# Patient Record
Sex: Female | Born: 1960 | Race: Asian | Hispanic: No | Marital: Married | Smoking: Never smoker
Health system: Southern US, Community
[De-identification: ages and names within clinical notes are randomized; demographics above are authoritative.]

## PROBLEM LIST (undated history)

## (undated) DIAGNOSIS — M5442 Lumbago with sciatica, left side: Secondary | ICD-10-CM

## (undated) DIAGNOSIS — G8929 Other chronic pain: Secondary | ICD-10-CM

## (undated) DIAGNOSIS — K219 Gastro-esophageal reflux disease without esophagitis: Secondary | ICD-10-CM

## (undated) DIAGNOSIS — R7303 Prediabetes: Secondary | ICD-10-CM

## (undated) DIAGNOSIS — Z853 Personal history of malignant neoplasm of breast: Secondary | ICD-10-CM

## (undated) DIAGNOSIS — I10 Essential (primary) hypertension: Secondary | ICD-10-CM

## (undated) HISTORY — DX: Other chronic pain: G89.29

## (undated) HISTORY — DX: Prediabetes: R73.03

## (undated) HISTORY — DX: Essential (primary) hypertension: I10

## (undated) HISTORY — DX: Personal history of malignant neoplasm of breast: Z85.3

## (undated) HISTORY — DX: Gastro-esophageal reflux disease without esophagitis: K21.9

## (undated) HISTORY — DX: Other chronic pain: M54.42

---

## 2002-09-04 HISTORY — PX: ESOPHAGOGASTRODUODENOSCOPY: SHX1529

## 2004-06-01 ENCOUNTER — Ambulatory Visit: Payer: Self-pay

## 2004-06-09 ENCOUNTER — Ambulatory Visit: Payer: Self-pay

## 2004-08-15 LAB — CONVERTED CEMR LAB: Pap Smear: NORMAL

## 2007-01-03 DIAGNOSIS — Z853 Personal history of malignant neoplasm of breast: Secondary | ICD-10-CM

## 2007-01-03 HISTORY — PX: MASTECTOMY: SHX3

## 2007-01-03 HISTORY — DX: Personal history of malignant neoplasm of breast: Z85.3

## 2007-08-12 ENCOUNTER — Ambulatory Visit: Payer: Self-pay | Admitting: Internal Medicine

## 2007-08-12 DIAGNOSIS — D649 Anemia, unspecified: Secondary | ICD-10-CM | POA: Insufficient documentation

## 2007-08-12 DIAGNOSIS — R42 Dizziness and giddiness: Secondary | ICD-10-CM | POA: Insufficient documentation

## 2007-08-12 LAB — CONVERTED CEMR LAB
ALT: 12 units/L (ref 0–35)
Albumin: 4.1 g/dL (ref 3.5–5.2)
BUN: 16 mg/dL (ref 6–23)
Basophils Relative: 0 % (ref 0.0–3.0)
Bilirubin, Direct: 0.1 mg/dL (ref 0.0–0.3)
CO2: 26 meq/L (ref 19–32)
Calcium: 9 mg/dL (ref 8.4–10.5)
Creatinine, Ser: 0.6 mg/dL (ref 0.4–1.2)
GFR calc Af Amer: 137 mL/min
Glucose, Bld: 73 mg/dL (ref 70–99)
HCT: 33.2 % — ABNORMAL LOW (ref 36.0–46.0)
Hemoglobin: 10.7 g/dL — ABNORMAL LOW (ref 12.0–15.0)
Lymphocytes Relative: 18.2 % (ref 12.0–46.0)
MCHC: 32.2 g/dL (ref 30.0–36.0)
Monocytes Absolute: 0.3 10*3/uL (ref 0.1–1.0)
Monocytes Relative: 3 % (ref 3.0–12.0)
Neutro Abs: 7.9 10*3/uL — ABNORMAL HIGH (ref 1.4–7.7)
RBC: 4.46 M/uL (ref 3.87–5.11)
RDW: 17.8 % — ABNORMAL HIGH (ref 11.5–14.6)
Sodium: 140 meq/L (ref 135–145)
TSH: 1.82 microintl units/mL (ref 0.35–5.50)
Total Protein: 7.6 g/dL (ref 6.0–8.3)

## 2007-08-13 ENCOUNTER — Encounter: Payer: Self-pay | Admitting: Internal Medicine

## 2013-01-02 LAB — HM COLONOSCOPY

## 2013-04-16 HISTORY — PX: COLONOSCOPY: SHX174

## 2015-10-18 ENCOUNTER — Encounter: Payer: Self-pay | Admitting: *Deleted

## 2016-07-23 ENCOUNTER — Encounter: Payer: Self-pay | Admitting: *Deleted

## 2016-07-23 DIAGNOSIS — Z139 Encounter for screening, unspecified: Secondary | ICD-10-CM

## 2016-07-23 LAB — GLUCOSE, POCT (MANUAL RESULT ENTRY): POC GLUCOSE: 197 mg/dL — AB (ref 70–99)

## 2016-07-23 NOTE — Congregational Nurse Program (Unsigned)
Congregational Nurse Program Note  Date of Encounter: 07/23/2016  Past Medical History: No past medical history on file.  Encounter Details:     CNP Questionnaire - 07/23/16 1400      Patient Demographics   Is this a new or existing patient? New   Patient is considered a/an Immigrant   Race Asian     Patient Assistance   Location of Patient Assistance Not Applicable  [facility]   Patient's financial/insurance status Self-Pay (Uninsured)   Uninsured Patient ([employer]/Care Connects) No   Patient referred to apply for the following financial assistance Not Applicable   Food insecurities addressed Not Applicable   Transportation assistance No   Assistance securing medications No   Educational health offerings Spiritual care;Exercise/physical activity;Safety;Nutrition     Encounter Details   Primary purpose of visit Education/Health Concerns;Spiritual Care/Support Visit;Other  referral MD   Was an Emergency Department visit averted? Not Applicable   Does patient have a medical provider? No   Patient referred to Doctor referral for a non-emergent behavioral health crisis   Was a mental health screening completed? (GAINS tool) No   Does patient have dental issues? No   Does patient have vision issues? No   Does your patient have an abnormal blood pressure today? No   Since previous encounter, have you referred patient for abnormal blood pressure that resulted in a new diagnosis or medication change? No   Does your patient have an abnormal blood glucose today? Yes  195, non fasting   Since previous encounter, have you referred patient for abnormal blood glucose that resulted in a new diagnosis or medication change? No   Was there a life-saving intervention made? No     stated been dizziness 3weeks, no energy. Said no primary Dr. For now. Same sx of last time. Referral to primary Dr. Hughes BetterNear her house, concerned language barrier, told her interpretor system or I can be with her. Will  set up appointment  And f/u.

## 2016-07-26 ENCOUNTER — Telehealth: Payer: Self-pay | Admitting: *Deleted

## 2016-08-07 ENCOUNTER — Encounter: Payer: Self-pay | Admitting: Family Medicine

## 2016-08-07 ENCOUNTER — Ambulatory Visit (INDEPENDENT_AMBULATORY_CARE_PROVIDER_SITE_OTHER): Payer: BLUE CROSS/BLUE SHIELD | Admitting: Family Medicine

## 2016-08-07 VITALS — BP 145/78 | HR 63 | Temp 98.2°F | Resp 16 | Ht 63.75 in | Wt 137.5 lb

## 2016-08-07 DIAGNOSIS — R5382 Chronic fatigue, unspecified: Secondary | ICD-10-CM | POA: Diagnosis not present

## 2016-08-07 DIAGNOSIS — Z862 Personal history of diseases of the blood and blood-forming organs and certain disorders involving the immune mechanism: Secondary | ICD-10-CM

## 2016-08-07 DIAGNOSIS — R6889 Other general symptoms and signs: Secondary | ICD-10-CM | POA: Diagnosis not present

## 2016-08-07 DIAGNOSIS — R42 Dizziness and giddiness: Secondary | ICD-10-CM | POA: Diagnosis not present

## 2016-08-07 NOTE — Progress Notes (Signed)
Office Note 08/07/2016  CC:  Chief Complaint  Patient presents with  . Establish Care    Previous PCP: Dr. Artist PaisYoo  . Fatigue  She last ate about 3.5 hrs ago.  HPI:  Caitlin Thomas is a 56 y.o. Asian female who is here with her congregational nurse as her translator to establish care and discuss fatigue.  Despite translator being present, there is definitely still a language barrier/poor historian. Patient's most recent primary MD: none (Dr. Artist PaisYoo in remote past). Old records were not reviewed prior to or during today's visit.  Fairly vague symptoms: Generalized fatigue, hot flushes, gets a bit dizzy.  Difficult to get any specific info regarding time course. Onset about 2 yrs ago, then got better, then has returned the last 4-6 wks.  Sx's constant?, but then patient seems to make statements that are c/w intermittent pattern.  Has to stay in bed more on some days.   Gets worse when she is hungry.  Appetite is good.  Eating more frequently does help.  Seems to happen more/worse in the heat. Menopause age 56.   No melena or hematochezia.  No abd pain.  No HAs.  No joint pain or rashes.  No fevers. No wt loss.   Some snoring but she doesn't have any info regarding possible apneic events.  Feels sleepy after eating. Denies any signif recent stressor or anxiety.  Denies depression.   [place]/14[place]  Past Medical History:  Diagnosis Date  . History of left breast cancer 2009   unclear hx of other treatments (dx'd and [place])---"5 yrs of medication, then stopped".  No radiation.    Past Surgical History:  Procedure Laterality Date  . COLONOSCOPY  04/16/2013   Normal per pt: [place]  . ESOPHAGOGASTRODUODENOSCOPY  09/04/2002   Anemia, wt loss, abd pain---NORMAL [place]  . MASTECTOMY Left 2009   [place]    Family History  Problem Relation Age of Onset  . Stroke Neg Hx    . Diabetes Neg Hx   . Cancer Neg Hx     Social History   Social History  . Marital status: Married    Spouse name: N/A  . Number of children: N/A  . Years of education: N/A   Occupational History  . Not on file.   Social History Main Topics  . Smoking status: Never Smoker  . Smokeless tobacco: Never Used  . Alcohol use No  . Drug use: No  . Sexual activity: Not on file   Other Topics Concern  . Not on file   Social History Narrative   Married, 1 daughter.   Educ: HS   Occup: housewife.   No T/A/Ds.    No outpatient encounter prescriptions on file as of 08/07/2016.   No facility-administered encounter medications on file as of 08/07/2016.     No Known Allergies  ROS See HPI, plus: Review of Systems  Constitutional: Negative for fever.  HENT: Negative for congestion and sore throat.   Eyes: Negative for visual disturbance.  Respiratory: Negative for cough.   Cardiovascular: Negative for chest pain.  Gastrointestinal: Negative for abdominal pain and nausea.  Genitourinary: Negative for dysuria.  Musculoskeletal: Negative for back pain and joint swelling.  Skin: Negative for rash.  Neurological: Negative for weakness and headaches.  Hematological: Negative for adenopathy. Does not bruise/bleed easily.  Psychiatric/Behavioral: Negative  for dysphoric mood. The patient is not nervous/anxious.     PE; Blood pressure (!) 145/78, pulse 63, temperature 98.2 F (36.8 C), temperature source Oral, resp. rate 16, height 5' 3.75" (1.619 m), weight 137 lb 8 oz (62.4 kg), SpO2 99 %.  Pt examined in the presence of her female Nurse, learning disability. Gen: Alert, well appearing.  Patient is oriented to person, place, time, and situation. AFFECT: pleasant, lucid thought and speech. ENT: Ears: EACs clear, normal epithelium.  TMs with good light reflex and landmarks bilaterally.  Eyes: no injection, icteris, swelling, or exudate.  EOMI, PERRLA. Nose: no drainage or turbinate edema/swelling.   No injection or focal lesion.  Mouth: lips without lesion/swelling.  Oral mucosa pink and moist.  Dentition intact and without obvious caries or gingival swelling.  Oropharynx without erythema, exudate, or swelling.  Neck - No masses or thyromegaly or limitation in range of motion.  No bruits. CV: RRR, no m/r/g.   LUNGS: CTA bilat, nonlabored resps, good aeration in all lung fields. ABD: soft, NT, ND, BS normal.  No hepatospenomegaly or mass.  No bruits. EXT: no clubbing, cyanosis, or edema.    Pertinent labs:  none  ASSESSMENT AND PLAN:   New pt; no old records available ([place]  1) Fatigue: with sense of heat intolerance/flushing sensation, and disequilibrium. Sounds somewhat like possible mild hypoglycemic sx's.  Pt has unclear hx of anemia--with normal EGD 2004. Colonoscopy reportedly normal 2015, [place]  No clear data was obtained during history (language barrier) or exam to suggest any particular diagnosis, so I'll check some general labs to start: CBC, CMET, TSH, iron studies. Additionally, I've reiterated the importance of eating 6 small meals per day--not letting herself go long enough to get really hungry. Encouraged pt to drink 60 oz water per day.  Avoid going out in hot weather for more than 30 min at a time.  An After Visit Summary was printed and given to the patient.  Return in about 2 weeks (around 08/21/2016) for f/u fatigue.  Signed:  Santiago Bumpers, MD           08/07/2016

## 2016-08-07 NOTE — Patient Instructions (Signed)
Eat 6 small meals per day, drink 60 oz of clear fluids (water) per day, avoid prolonged periods (>30 min) in the heat.

## 2016-08-08 ENCOUNTER — Encounter: Payer: Self-pay | Admitting: *Deleted

## 2016-08-08 LAB — COMPREHENSIVE METABOLIC PANEL
ALBUMIN: 4.6 g/dL (ref 3.6–5.1)
ALT: 18 U/L (ref 6–29)
AST: 18 U/L (ref 10–35)
Alkaline Phosphatase: 58 U/L (ref 33–130)
BUN: 18 mg/dL (ref 7–25)
CHLORIDE: 103 mmol/L (ref 98–110)
CO2: 28 mmol/L (ref 20–32)
Calcium: 9.3 mg/dL (ref 8.6–10.4)
Creat: 0.63 mg/dL (ref 0.50–1.05)
Glucose, Bld: 130 mg/dL — ABNORMAL HIGH (ref 65–99)
POTASSIUM: 4.3 mmol/L (ref 3.5–5.3)
Sodium: 138 mmol/L (ref 135–146)
TOTAL PROTEIN: 7.5 g/dL (ref 6.1–8.1)
Total Bilirubin: 0.5 mg/dL (ref 0.2–1.2)

## 2016-08-08 LAB — CBC WITH DIFFERENTIAL/PLATELET
BASOS ABS: 0 {cells}/uL (ref 0–200)
Basophils Relative: 0 %
EOS PCT: 3 %
Eosinophils Absolute: 228 cells/uL (ref 15–500)
HEMATOCRIT: 43.1 % (ref 35.0–45.0)
HEMOGLOBIN: 13.8 g/dL (ref 11.7–15.5)
LYMPHS ABS: 2888 {cells}/uL (ref 850–3900)
Lymphocytes Relative: 38 %
MCH: 29.9 pg (ref 27.0–33.0)
MCHC: 32 g/dL (ref 32.0–36.0)
MCV: 93.5 fL (ref 80.0–100.0)
MONO ABS: 532 {cells}/uL (ref 200–950)
MPV: 9.8 fL (ref 7.5–12.5)
Monocytes Relative: 7 %
NEUTROS PCT: 52 %
Neutro Abs: 3952 cells/uL (ref 1500–7800)
Platelets: 307 10*3/uL (ref 140–400)
RBC: 4.61 MIL/uL (ref 3.80–5.10)
RDW: 13 % (ref 11.0–15.0)
WBC: 7.6 10*3/uL (ref 3.8–10.8)

## 2016-08-08 LAB — IRON AND TIBC
%SAT: 31 % (ref 11–50)
Iron: 112 ug/dL (ref 45–160)
TIBC: 361 ug/dL (ref 250–450)
UIBC: 249 ug/dL

## 2016-08-08 LAB — TSH: TSH: 2.58 mIU/L

## 2016-08-08 LAB — FERRITIN: FERRITIN: 166 ng/mL (ref 10–232)

## 2016-08-08 NOTE — Congregational Nurse Program (Unsigned)
Congregational Nurse Program Note  Date of Encounter: 08/07/2016 Past Medical History: Past Medical History:  Diagnosis Date  . History of left breast cancer 2009   unclear hx of other treatments (dx[place] and [place])---"5 yrs of medication, then stopped".  No radiation.    Encounter Details:     CNP Questionnaire - 08/07/16 1500      Patient Demographics   Is this a new or existing patient? Existing   Patient is considered a/an Immigrant   Race Asian     Patient Assistance   Location of Patient Assistance Not Applicable   Patient's financial/insurance status Private Insurance Coverage   Uninsured Patient ([employer] Connects) No   Patient referred to apply for the following financial assistance Not Applicable   Food insecurities addressed Not Applicable   Transportation assistance No   Assistance securing medications No   Educational health offerings Nutrition     Encounter Details   Primary purpose of visit Navigating the Healthcare System  translate   Was an Emergency Department visit averted? Not Applicable   Does patient have a medical provider? Yes   Patient referred to Not Applicable   Was a mental health screening completed? (GAINS tool) No   Does patient have dental issues? No   Does patient have vision issues? No   Does your patient have an abnormal blood pressure today? No  see clinic vs   Since previous encounter, have you referred patient for abnormal blood pressure that resulted in a new diagnosis or medication change? No   Does your patient have an abnormal blood glucose today? No   Since previous encounter, have you referred patient for abnormal blood glucose that resulted in a new diagnosis or medication change? No   Was there a life-saving intervention made? No     visited  Dr. Milinda CaveMcGowen today. Labs drawn, f/u in 2weeks.  small 6meals, intake lots of water for sudden hunger and rehydration. C/o remains empty feeling even though ate a  lot. Advised to eat more protein and healthy fat like nuts, cheeze, avocado and  vegetables than carbohydrates. Will follow up with pt.

## 2016-08-21 ENCOUNTER — Encounter: Payer: Self-pay | Admitting: Family Medicine

## 2016-08-21 ENCOUNTER — Ambulatory Visit (INDEPENDENT_AMBULATORY_CARE_PROVIDER_SITE_OTHER): Payer: BLUE CROSS/BLUE SHIELD | Admitting: Family Medicine

## 2016-08-21 VITALS — BP 129/79 | HR 60 | Temp 98.5°F | Resp 16 | Ht 63.75 in | Wt 137.0 lb

## 2016-08-21 DIAGNOSIS — R5382 Chronic fatigue, unspecified: Secondary | ICD-10-CM | POA: Diagnosis not present

## 2016-08-21 NOTE — Patient Instructions (Addendum)
Take Vit D 1000 Unit tab once daily --over the counter. Take Magnesium Oxide 500mg , 1 tab daily--over the counter. Take Vit B2 (riboflavin) 100mg , 1 tab daily--over the counter.

## 2016-08-21 NOTE — Progress Notes (Signed)
OFFICE VISIT  08/21/2016   CC:  Chief Complaint  Patient presents with  . Follow-up    fatigue    HPI:    Patient is a 56 y.o. Asian female who presents for 2 week f/u fatigue.  Here with congregational nurse to function as interpreter, although there is quite a bit of deficiency in this regard.  Last visit I did a general lab w/u and this all returned normal.  Still says things are unchanged regarding fatigue.  She has been able to eat a bit more frequently to avoid significant hunger and this has been mildly helpful with her episodes of feeling shaky and flushed and more fatigued.  No fevers, her wt is stable, appetite is good, no myalgias/arthralgias/bone pain.    Past Medical History:  Diagnosis Date  . History of left breast cancer 2009   unclear hx of other treatments (dx'd and [place] in [place])---"5 yrs of medication, then stopped".  No radiation.    Past Surgical History:  Procedure Laterality Date  . COLONOSCOPY  04/16/2013   Normal per pt: no records b/c [place] was [place]  . ESOPHAGOGASTRODUODENOSCOPY  09/04/2002   Anemia, wt loss, abd pain---NORMAL (Dr. Victorino Dike)  . MASTECTOMY Left 2009   [place]    No outpatient prescriptions prior to visit.   No facility-administered medications prior to visit.     No Known Allergies  ROS As per HPI  PE: Blood pressure 129/79, pulse 60, temperature 98.5 F (36.9 C), temperature source Oral, resp. rate 16, height 5' 3.75" (1.619 m), weight 137 lb (62.1 kg), SpO2 96 %. Gen: Alert, well appearing.  Patient is oriented to person, place, time, and situation. AFFECT: pleasant, lucid thought and speech.   LABS:   Lab Results  Component Value Date   IRON 112 08/07/2016   TIBC 361 08/07/2016   FERRITIN 166 08/07/2016    Lab Results  Component Value Date   TSH 2.58 08/07/2016   Lab Results  Component Value Date   WBC 7.6 08/07/2016   HGB 13.8 08/07/2016   HCT  43.1 08/07/2016   MCV 93.5 08/07/2016   PLT 307 08/07/2016   Lab Results  Component Value Date   CREATININE 0.63 08/07/2016   BUN 18 08/07/2016   NA 138 08/07/2016   K 4.3 08/07/2016   CL 103 08/07/2016   CO2 28 08/07/2016   Lab Results  Component Value Date   ALT 18 08/07/2016   AST 18 08/07/2016   ALKPHOS 58 08/07/2016   BILITOT 0.5 08/07/2016    IMPRESSION AND PLAN:  Chronic fatigue, with some vague physical symptoms likely related to significant hunger + heat. No red flags for any significant pathology at this time. Lab w/u last visit reassuring.  Instructions: Take Vit D 1000 Unit tab once daily --over the counter. Take Magnesium Oxide 500mg , 1 tab daily--over the counter. Take Vit B2 (riboflavin) 100mg , 1 tab daily--over the counter. Walk 30 min per day except on very hot days. Continue to eat 6 small meals per day to avoid significant hunger.  An After Visit Summary was printed and given to the patient.  FOLLOW UP: No Follow-up on file.  Signed:  Santiago Bumpers, MD           08/21/2016

## 2016-08-22 ENCOUNTER — Encounter: Payer: Self-pay | Admitting: *Deleted

## 2016-08-22 NOTE — Congregational Nurse Program (Signed)
Congregational Nurse Program Note  Date of Encounter:08/21/2016 Past Medical History: Past Medical History:  Diagnosis Date  . History of left breast cancer 2009   unclear hx of other treatments (dx'd and [place])---"5 yrs of medication, then stopped".  No radiation.    Encounter Details:     CNP Questionnaire - 08/21/16 1200      Patient Demographics   Is this a new or existing patient? Existing   Patient is considered a/an Immigrant   Race Asian     Patient Assistance   Location of Patient Assistance Not Applicable   Patient's financial/insurance status Private Insurance Coverage   Uninsured Patient ([employer] Card/[employer] Connects) No   Patient referred to apply for the following financial assistance Not Applicable   Food insecurities addressed Not Applicable   Transportation assistance No   Assistance securing medications No   Educational health offerings Nutrition     Encounter Details   Primary purpose of visit Navigating the Healthcare System  translated   Was an Emergency Department visit averted? Not Applicable   Does patient have a medical provider? Yes   Patient referred to Not Applicable   Was a mental health screening completed? (GAINS tool) No   Does patient have dental issues? No   Does patient have vision issues? No   Does your patient have an abnormal blood pressure today? No   Since previous encounter, have you referred patient for abnormal blood pressure that resulted in a new diagnosis or medication change? No   Does your patient have an abnormal blood glucose today? No  NA   Since previous encounter, have you referred patient for abnormal blood glucose that resulted in a new diagnosis or medication change? No   Was there a life-saving intervention made? No     F/U visit Dr. Milinda Cave. The sx was same but sudden hunger was little bit better. Remains fatigue. Vit D 1000 unit , Magnesium oxide 500mg  ,, Vit.B1 over the counter drug PO everyday,  see him 2 month later. Will check up later.

## 2016-09-13 ENCOUNTER — Encounter: Payer: Self-pay | Admitting: *Deleted

## 2016-09-13 NOTE — Congregational Nurse Program (Unsigned)
Congregational Nurse Program Note  Date of Encounter: 09/13/2016  Past Medical History: Past Medical History:  Diagnosis Date  . History of left breast cancer 2009   unclear hx of other treatments (dx'd and [place] in [place])---"5 yrs of medication, then stopped".  No radiation.    Encounter Details:     CNP Questionnaire - 09/13/16 0800      Patient Demographics   Is this a new or existing patient? Existing   Patient is considered a/an Immigrant   Race Asian     Patient Assistance   Location of Patient Assistance Not Applicable  home visit   Patient's financial/insurance status Private Insurance Coverage   Uninsured Patient ([employer] Card/[employer] Connects) No   Patient referred to apply for the following financial assistance Not Applicable   Food insecurities addressed Not Applicable   Transportation assistance No   Assistance securing medications No   Educational health offerings Spiritual care     Encounter Details   Primary purpose of visit Spiritual Care/Support Visit   Was an Emergency Department visit averted? Not Applicable   Does patient have a medical provider? Yes   Patient referred to Not Applicable   Was a mental health screening completed? (GAINS tool) No   Does patient have dental issues? No   Does patient have vision issues? No   Does your patient have an abnormal blood pressure today? No   Since previous encounter, have you referred patient for abnormal blood pressure that resulted in a new diagnosis or medication change? No   Does your patient have an abnormal blood glucose today? No   Since previous encounter, have you referred patient for abnormal blood glucose that resulted in a new diagnosis or medication change? No   Was there a life-saving intervention made? No     Home visit for follow up. Felt better, remains less energy. Spiritual care given

## 2016-09-17 ENCOUNTER — Encounter: Payer: Self-pay | Admitting: *Deleted

## 2016-09-17 DIAGNOSIS — Z23 Encounter for immunization: Secondary | ICD-10-CM

## 2016-09-22 ENCOUNTER — Encounter: Payer: Self-pay | Admitting: Family Medicine

## 2016-10-02 DIAGNOSIS — R7303 Prediabetes: Secondary | ICD-10-CM

## 2016-10-02 HISTORY — DX: Prediabetes: R73.03

## 2016-10-19 ENCOUNTER — Ambulatory Visit (INDEPENDENT_AMBULATORY_CARE_PROVIDER_SITE_OTHER): Payer: BLUE CROSS/BLUE SHIELD | Admitting: Family Medicine

## 2016-10-19 ENCOUNTER — Encounter: Payer: Self-pay | Admitting: Family Medicine

## 2016-10-19 VITALS — BP 133/88 | HR 69 | Temp 97.6°F | Resp 16 | Ht 63.75 in | Wt 141.0 lb

## 2016-10-19 DIAGNOSIS — M5432 Sciatica, left side: Secondary | ICD-10-CM

## 2016-10-19 DIAGNOSIS — R739 Hyperglycemia, unspecified: Secondary | ICD-10-CM

## 2016-10-19 DIAGNOSIS — R5382 Chronic fatigue, unspecified: Secondary | ICD-10-CM

## 2016-10-19 LAB — HEMOGLOBIN A1C: HEMOGLOBIN A1C: 6 % (ref 4.6–6.5)

## 2016-10-19 LAB — GLUCOSE, RANDOM: Glucose, Bld: 118 mg/dL — ABNORMAL HIGH (ref 70–99)

## 2016-10-19 MED ORDER — PREDNISONE 20 MG PO TABS: ORAL_TABLET | ORAL | 0 refills | Status: DC

## 2016-10-19 NOTE — Patient Instructions (Signed)
Low Back Sprain Rehab  Ask your health care provider which exercises are safe for you. Do exercises exactly as told by your health care provider and adjust them as directed. It is normal to feel mild stretching, pulling, tightness, or discomfort as you do these exercises, but you should stop right away if you feel sudden pain or your pain gets worse. Do not begin these exercises until told by your health care provider.  Stretching and range of motion exercises  These exercises warm up your muscles and joints and improve the movement and flexibility of your back. These exercises also help to relieve pain, numbness, and tingling.  Exercise A: Lumbar rotation    1. Lie on your back on a firm surface and bend your knees.  2. Straighten your arms out to your sides so each arm forms an "L" shape with a side of your body (a 90 degree angle).  3. Slowly move both of your knees to one side of your body until you feel a stretch in your lower back. Try not to let your shoulders move off of the floor.  4. Hold for __________ seconds.  5. Tense your abdominal muscles and slowly move your knees back to the starting position.  6. Repeat this exercise on the other side of your body.  Repeat __________ times. Complete this exercise __________ times a day.  Exercise B: Prone extension on elbows    1. Lie on your abdomen on a firm surface.  2. Prop yourself up on your elbows.  3. Use your arms to help lift your chest up until you feel a gentle stretch in your abdomen and your lower back.  ? This will place some of your body weight on your elbows. If this is uncomfortable, try stacking pillows under your chest.  ? Your hips should stay down, against the surface that you are lying on. Keep your hip and back muscles relaxed.  4. Hold for __________ seconds.  5. Slowly relax your upper body and return to the starting position.  Repeat __________ times. Complete this exercise __________ times a day.  Strengthening exercises  These  exercises build strength and endurance in your back. Endurance is the ability to use your muscles for a long time, even after they get tired.  Exercise C: Pelvic tilt  1. Lie on your back on a firm surface. Bend your knees and keep your feet flat.  2. Tense your abdominal muscles. Tip your pelvis up toward the ceiling and flatten your lower back into the floor.  ? To help with this exercise, you may place a small towel under your lower back and try to push your back into the towel.  3. Hold for __________ seconds.  4. Let your muscles relax completely before you repeat this exercise.  Repeat __________ times. Complete this exercise __________ times a day.  Exercise D: Alternating arm and leg raises    1. Get on your hands and knees on a firm surface. If you are on a hard floor, you may want to use padding to cushion your knees, such as an exercise mat.  2. Line up your arms and legs. Your hands should be below your shoulders, and your knees should be below your hips.  3. Lift your left leg behind you. At the same time, raise your right arm and straighten it in front of you.  ? Do not lift your leg higher than your hip.  ? Do not lift your arm   higher than your shoulder.  ? Keep your abdominal and back muscles tight.  ? Keep your hips facing the ground.  ? Do not arch your back.  ? Keep your balance carefully, and do not hold your breath.  4. Hold for __________ seconds.  5. Slowly return to the starting position and repeat with your right leg and your left arm.  Repeat __________ times. Complete this exercise __________ times a day.  Exercise E: Abdominal set with straight leg raise    1. Lie on your back on a firm surface.  2. Bend one of your knees and keep your other leg straight.  3. Tense your abdominal muscles and lift your straight leg up, 4-6 inches (10-15 cm) off the ground.  4. Keep your abdominal muscles tight and hold for __________ seconds.  ? Do not hold your breath.  ? Do not arch your back. Keep it  flat against the ground.  5. Keep your abdominal muscles tense as you slowly lower your leg back to the starting position.  6. Repeat with your other leg.  Repeat __________ times. Complete this exercise __________ times a day.  Posture and body mechanics    Body mechanics refers to the movements and positions of your body while you do your daily activities. Posture is part of body mechanics. Good posture and healthy body mechanics can help to relieve stress in your body's tissues and joints. Good posture means that your spine is in its natural S-curve position (your spine is neutral), your shoulders are pulled back slightly, and your head is not tipped forward. The following are general guidelines for applying improved posture and body mechanics to your everyday activities.  Standing    · When standing, keep your spine neutral and your feet about hip-width apart. Keep a slight bend in your knees. Your ears, shoulders, and hips should line up.  · When you do a task in which you stand in one place for a long time, place one foot up on a stable object that is 2-4 inches (5-10 cm) high, such as a footstool. This helps keep your spine neutral.  Sitting    · When sitting, keep your spine neutral and keep your feet flat on the floor. Use a footrest, if necessary, and keep your thighs parallel to the floor. Avoid rounding your shoulders, and avoid tilting your head forward.  · When working at a desk or a computer, keep your desk at a height where your hands are slightly lower than your elbows. Slide your chair under your desk so you are close enough to maintain good posture.  · When working at a computer, place your monitor at a height where you are looking straight ahead and you do not have to tilt your head forward or downward to look at the screen.  Resting    · When lying down and resting, avoid positions that are most painful for you.  · If you have pain with activities such as sitting, bending, stooping, or squatting  (flexion-based activities), lie in a position in which your body does not bend very much. For example, avoid curling up on your side with your arms and knees near your chest (fetal position).  · If you have pain with activities such as standing for a long time or reaching with your arms (extension-based activities), lie with your spine in a neutral position and bend your knees slightly. Try the following positions:  · Lying on your side with a   pillow between your knees.  · Lying on your back with a pillow under your knees.  Lifting    · When lifting objects, keep your feet at least shoulder-width apart and tighten your abdominal muscles.  · Bend your knees and hips and keep your spine neutral. It is important to lift using the strength of your legs, not your back. Do not lock your knees straight out.  · Always ask for help to lift heavy or awkward objects.  This information is not intended to replace advice given to you by your health care provider. Make sure you discuss any questions you have with your health care provider.  Document Released: 12/19/2004 Document Revised: 08/26/2015 Document Reviewed: 09/30/2014  [employer] Interactive Patient Education © 2018 [employer]

## 2016-10-19 NOTE — Progress Notes (Signed)
OFFICE VISIT  10/19/2016   CC:  Chief Complaint  Patient presents with  . Follow-up    fatigue  . Back Pain    HPI:    Patient is a 56 y.o. Asian female who presents accompanied by an interpreter for 2 mo f/u fatigue. Lab w/u has been unremarkable except some mild hyperglycemia--but not clear at all whether these were fasting or not.  After last visit I recommended she start otc vit D 1000 U, Mag oxide 500mg  qd, 100 mg riboflavin qd, and walk 30 min per day except on very hot days.  Continue eating 6 small meals per day to avoid significant hunger.  She feels much improved, energy level is good.  She has been compliant with my recommendations noted above.  Onset of L glut pain 1 mo ago and this radiates all the way down to foot and extends into L LB sometimes.  Also paresthesias in L leg as well. Some L leg decreased strength that pt associates with her pain.  Lying down and sitting makes it better.  Getting up and standing/walking makes it worse. No bowel or bladder dysfxn, no saddle anesthesia.  Has taken ibup 200 mg daily for about the last 2 weeks. This has helped some.   Since onset up to now, she says the sx's are worse.  No strain or trauma prior to onset.  No hx of this in the past.   Past Medical History:  Diagnosis Date  . History of left breast cancer 2009   unclear hx of other treatments (dx'd and treated in [place])---"5 yrs of medication, then stopped".  No radiation.    Past Surgical History:  Procedure Laterality Date  . COLONOSCOPY  04/16/2013   Normal per pt: no records b/c it was done in [place]. [place]  . ESOPHAGOGASTRODUODENOSCOPY  09/04/2002   Anemia, wt loss, abd pain---NORMAL (Dr. Victorino Dike)  . MASTECTOMY Left 2009   done in [place] for left breast cancer    No outpatient prescriptions prior to visit.   No facility-administered medications prior to visit.     No Known Allergies  ROS As per HPI  PE: Blood pressure 133/88, pulse 69,  temperature 97.6 F (36.4 C), temperature source Oral, resp. rate 16, height 5' 3.75" (1.619 m), weight 141 lb (64 kg), SpO2 98 %. Gen: Alert, well appearing.  Patient is oriented to person, place, time, and situation. AFFECT: pleasant, lucid thought and speech. L glut with focal TTP at ischial tuberosity region. No low back or SI region TTP.  Sitting SLR neg bilat.  LE strength 5/5 prox/dist bilat. DTRs 1+ patellar bilat, trace achilles bilat.  LABS:   Lab Results  Component Value Date   TSH 2.58 08/07/2016   Lab Results  Component Value Date   WBC 7.6 08/07/2016   HGB 13.8 08/07/2016   HCT 43.1 08/07/2016   MCV 93.5 08/07/2016   PLT 307 08/07/2016   Lab Results  Component Value Date   CREATININE 0.63 08/07/2016   BUN 18 08/07/2016   NA 138 08/07/2016   K 4.3 08/07/2016   CL 103 08/07/2016   CO2 28 08/07/2016   Lab Results  Component Value Date   ALT 18 08/07/2016   AST 18 08/07/2016   ALKPHOS 58 08/07/2016   BILITOT 0.5 08/07/2016      IMPRESSION AND PLAN:  1) Left sided sciatica: trial of prednisone 40mg  qd x 5d, then 20mg  qd x 5d. Home stretching regimen handout given.  Stop ibuprofen.   If not improved, will refer to PT.  2) Chronic fatigue: resolved.  Continue current supplements, eating habits, hydration habits, and exercise.  3) Hyperglycemia: will check random glucose today as well as Hba1c .  An After Visit Summary was printed and given to the patient.  FOLLOW UP: Return in about 2 weeks (around 11/02/2016) for f/u sciatica.  Signed:  Santiago BumpersPhil McGowen, MD           10/19/2016

## 2016-10-21 ENCOUNTER — Encounter: Payer: Self-pay | Admitting: *Deleted

## 2016-10-21 ENCOUNTER — Telehealth: Payer: Self-pay | Admitting: *Deleted

## 2016-10-21 NOTE — Congregational Nurse Program (Unsigned)
Congregational Nurse Program Note  Date of Encounter:10/20/2016 Past Medical History: Past Medical History:  Diagnosis Date  . History of left breast cancer 2009   unclear hx of other treatments (dx'd and treated in [place])---"5 yrs of medication, then stopped".  No radiation.  . Prediabetes 10/2016   A1c 6.0%--dietary advice given    Encounter Details:     CNP Questionnaire - 10/19/16 1100      Patient Demographics   Is this a new or existing patient? Existing   Race Asian     Patient Assistance   Location of Patient Assistance Not Applicable   Patient's financial/insurance status Private Insurance Coverage   Uninsured Patient ([place] [employer] Connects) No   Patient referred to apply for the following financial assistance Not Applicable   Food insecurities addressed Not Applicable   Transportation assistance No   Assistance securing medications No   Educational health offerings Exercise/physical activity     Encounter Details   Primary purpose of visit Navigating the Healthcare System  translate   Was an Emergency Department visit averted? Not Applicable   Does patient have a medical provider? Yes   Patient referred to Not Applicable   Was a mental health screening completed? (GAINS tool) No   Does patient have dental issues? No   Does patient have vision issues? No   Does your patient have an abnormal blood pressure today? Yes  clinic took BP, 138/78   Since previous encounter, have you referred patient for abnormal blood pressure that resulted in a new diagnosis or medication change? No   Does your patient have an abnormal blood glucose today? No  lab drawn at clinic   Since previous encounter, have you referred patient for abnormal blood glucose that resulted in a new diagnosis or medication change? No   Was there a life-saving intervention made? No      Received call from Dr. Milinda CaveMcGowen office , Hgb A1c result and diet information received, called pt, explain  Hgb A1C and diet information given

## 2016-10-21 NOTE — Congregational Nurse Program (Unsigned)
Congregational Nurse Program Note  Date of Encounter: 10/19/2016  Past Medical History: Past Medical History:  Diagnosis Date  . History of left breast cancer 2009   unclear hx of other treatments (dx'd and treated in [place]---"5 yrs of medication, then stopped".  No radiation.  . Prediabetes 10/2016   A1c 6.0%--dietary advice given    Encounter Details:     CNP Questionnaire - 10/19/16 1100      Patient Demographics   Is this a new or existing patient? Existing   Race Asian     Patient Assistance   Location of Patient Assistance Not Applicable   Patient's financial/insurance status Private Insurance Coverage   Uninsured Patient ([place] Card/Care Connects) No   Patient referred to apply for the following financial assistance Not Applicable   Food insecurities addressed Not Applicable   Transportation assistance No   Assistance securing medications No   Educational health offerings Exercise/physical activity     Encounter Details   Primary purpose of visit Navigating the Healthcare System  translate   Was an Emergency Department visit averted? Not Applicable   Does patient have a medical provider? Yes   Patient referred to Not Applicable   Was a mental health screening completed? (GAINS tool) No   Does patient have dental issues? No   Does patient have vision issues? No   Does your patient have an abnormal blood pressure today? Yes  clinic took BP, 138/78   Since previous encounter, have you referred patient for abnormal blood pressure that resulted in a new diagnosis or medication change? No   Does your patient have an abnormal blood glucose today? No  lab drawn at clinic   Since previous encounter, have you referred patient for abnormal blood glucose that resulted in a new diagnosis or medication change? No   Was there a life-saving intervention made? No     f/u visit to Dr Milinda CaveMcGowen,  Lt Gluteal pain, radiated to Lt Heel. Dr. Milinda CaveMcgowen examed, ordered Prednision  and see 2weeks Print out exercise for hip pain. Made appointment on 11/02/2016, requested interpretor due to I cannot be with her.

## 2016-10-29 ENCOUNTER — Encounter: Payer: Self-pay | Admitting: *Deleted

## 2016-10-29 NOTE — Congregational Nurse Program (Unsigned)
Congregational Nurse Program Note  Date of Encounter: 10/29/2016  Past Medical History: Past Medical History:  Diagnosis Date  . History of left breast cancer 2009   unclear hx of other treatments (dx'd and treated in [place])---"5 yrs of medication, then stopped".  No radiation.  . Prediabetes 10/2016   A1c 6.0%--dietary advice given    Encounter Details:     CNP Questionnaire - 10/29/16 1245      Patient Demographics   Is this a new or existing patient? Existing   Patient is considered a/an Immigrant   Race Asian     Patient Assistance   Location of Patient Assistance Not Applicable  [facility]   Patient's financial/insurance status Private Insurance Coverage   Uninsured Patient ([place] Card/Care Connects) No   Patient referred to apply for the following financial assistance Not Applicable   Food insecurities addressed Not Applicable   Transportation assistance No   Assistance securing medications No   Educational health offerings Exercise/physical activity;Other     Encounter Details   Primary purpose of visit Post PCP Visit;Other  see CN note   Was an Emergency Department visit averted? Not Applicable   Does patient have a medical provider? Yes   Patient referred to Not Applicable   Was a mental health screening completed? (GAINS tool) No   Does patient have dental issues? No   Does patient have vision issues? No   Does your patient have an abnormal blood pressure today? No   Since previous encounter, have you referred patient for abnormal blood pressure that resulted in a new diagnosis or medication change? No   Does your patient have an abnormal blood glucose today? No  NA   Since previous encounter, have you referred patient for abnormal blood glucose that resulted in a new diagnosis or medication change? No   Was there a life-saving intervention made? No    pt. Stated if she cancel Dr's appointment til I can go with her. Told her that she needs follow up after  medicine with Dr.  Illene Bolus/o the prednision did not help either exercise. Remains pain Lt. leg and to the back. Told her Dr. Milinda CaveMcGowen said physical Tx if that meds not help, but she wanted get a shot ( one of her friend had same sx, told her  the shot help.) I advised to follow up Dr. And ask him, and told her translator will be there, if not telephone service can use. Will f/u next week.

## 2016-11-02 ENCOUNTER — Encounter: Payer: Self-pay | Admitting: Family Medicine

## 2016-11-02 ENCOUNTER — Ambulatory Visit (INDEPENDENT_AMBULATORY_CARE_PROVIDER_SITE_OTHER): Payer: BLUE CROSS/BLUE SHIELD | Admitting: Family Medicine

## 2016-11-02 VITALS — BP 142/81 | HR 70 | Temp 98.3°F | Resp 16 | Ht 63.75 in | Wt 140.5 lb

## 2016-11-02 DIAGNOSIS — M5432 Sciatica, left side: Secondary | ICD-10-CM | POA: Diagnosis not present

## 2016-11-02 MED ORDER — GABAPENTIN 100 MG PO CAPS: ORAL_CAPSULE | ORAL | 0 refills | Status: DC

## 2016-11-02 NOTE — Progress Notes (Signed)
OFFICE VISIT  11/02/2016   CC:  Chief Complaint  Patient presents with  . Follow-up    sciatica   HPI:    Patient is a 56 y.o. Asian female who presents with interpreter Sunmi for 2 week f/u of left sided sciatica. I rx'd prednisone and home exercises/stretching last visit. She says this did not help.  Pain starts in L upper hip posteriorly and L glut and radiates down back and side of left leg down to the toes--present constantly. Also now with right posterio upper hip and glut pain but no radicular sx's.  No paresthesias on either side.  Currently taking 200mg  ibup only once daily prn.  Denies low back pain or hip pain. No leg weakness.  No saddle anesthesia or loss of B/B control.    Past Medical History:  Diagnosis Date  . History of left breast cancer 2009   unclear hx of other treatments (dx'd and treated in [place])---"5 yrs of medication, then stopped".  No radiation.  . Prediabetes 10/2016   A1c 6.0%--dietary advice given    Past Surgical History:  Procedure Laterality Date  . COLONOSCOPY  04/16/2013   Normal per pt: no records b/c it was done in [place]. [place].  . ESOPHAGOGASTRODUODENOSCOPY  09/04/2002   Anemia, wt loss, abd pain---NORMAL (Dr. Victorino Dike)  . MASTECTOMY Left 2009   done in [place] for left breast cancer    Outpatient Medications Prior to Visit  Medication Sig Dispense Refill  . predniSONE (DELTASONE) 20 MG tablet 2 tabs po qd x 5d, then 1 tab po qd x 5d (Patient not taking: Reported on 11/02/2016) 15 tablet 0   No facility-administered medications prior to visit.     No Known Allergies  ROS As per HPI  PE: Blood pressure (!) 142/81, pulse 70, temperature 98.3 F (36.8 C), temperature source Oral, resp. rate 16, height 5' 3.75" (1.619 m), weight 140 lb 8 oz (63.7 kg), SpO2 97 %. Gen: Alert, well appearing.  Patient is oriented to person, place, time, and situation. AFFECT: pleasant, lucid thought and speech. BACK: ROM fully intact  w/out pain. Hip and SI joint ROM intact and w/out pain. She has some L upper glut TTP as well as TTP over ischial tuberosity BILAT. Supine SLR neg bilat.  LE strength 5/5 prox/dist bilat. Patellar DTRs 1+ bilat, achilles DTRs trace bilat.  LABS:    Chemistry      Component Value Date/Time   NA 138 08/07/2016 1443   K 4.3 08/07/2016 1443   CL 103 08/07/2016 1443   CO2 28 08/07/2016 1443   BUN 18 08/07/2016 1443   CREATININE 0.63 08/07/2016 1443      Component Value Date/Time   CALCIUM 9.3 08/07/2016 1443   ALKPHOS 58 08/07/2016 1443   AST 18 08/07/2016 1443   ALT 18 08/07/2016 1443   BILITOT 0.5 08/07/2016 1443     Lab Results  Component Value Date   HGBA1C 6.0 10/19/2016    IMPRESSION AND PLAN:  1) Sciatica, left.  ?May be starting on the right.  No improvement with our initial conservative therapies. Will now refer to formal PT and start gabapentin 100 tid, increase to 200 tid in 10d. Therapeutic expectations and side effect profile of medication discussed today.  Patient's questions answered. Signs/symptoms to call or return for were reviewed and pt expressed understanding.  An After Visit Summary was printed and given to the patient.  FOLLOW UP: Return in about 6 weeks (around 12/14/2016)  for f/u sciatica.  Signed:  Santiago BumpersPhil Tajia Szeliga, MD           11/02/2016

## 2016-11-02 NOTE — Patient Instructions (Signed)
Take ONE of the 100 mg gabapentin tabs three times per day. After 10 days, increase to TWO tabs three times per day.

## 2016-11-07 ENCOUNTER — Telehealth: Payer: Self-pay | Admitting: *Deleted

## 2016-11-08 ENCOUNTER — Ambulatory Visit: Payer: BLUE CROSS/BLUE SHIELD

## 2016-11-08 ENCOUNTER — Other Ambulatory Visit: Payer: Self-pay

## 2016-11-08 ENCOUNTER — Ambulatory Visit (INDEPENDENT_AMBULATORY_CARE_PROVIDER_SITE_OTHER): Payer: BLUE CROSS/BLUE SHIELD | Admitting: Physical Therapy

## 2016-11-08 ENCOUNTER — Encounter: Payer: Self-pay | Admitting: Physical Therapy

## 2016-11-08 DIAGNOSIS — M5442 Lumbago with sciatica, left side: Secondary | ICD-10-CM

## 2016-11-08 DIAGNOSIS — M5441 Lumbago with sciatica, right side: Secondary | ICD-10-CM

## 2016-11-08 DIAGNOSIS — R293 Abnormal posture: Secondary | ICD-10-CM

## 2016-11-08 NOTE — Patient Instructions (Signed)
Piriformis Stretch    Lying on back, pull right knee toward opposite shoulder. Hold __30__ seconds.  Repeat with other leg. Repeat __3__ times. Do __2-3__ sessions per day.    Trigger Point Dry Needling  . What is Trigger Point Dry Needling (DN)? o DN is a physical therapy technique used to treat muscle pain and dysfunction. Specifically, DN helps deactivate muscle trigger points (muscle knots).  o A thin filiform needle is used to penetrate the skin and stimulate the underlying trigger point. The goal is for a local twitch response (LTR) to occur and for the trigger point to relax. No medication of any kind is injected during the procedure.   . What Does Trigger Point Dry Needling Feel Like?  o The procedure feels different for each individual patient. Some patients report that they do not actually feel the needle enter the skin and overall the process is not painful. Very mild bleeding may occur. However, many patients feel a deep cramping in the muscle in which the needle was inserted. This is the local twitch response.   Marland Kitchen. How Will I feel after the treatment? o Soreness is normal, and the onset of soreness may not occur for a few hours. Typically this soreness does not last longer than two days.  o Bruising is uncommon, however; ice can be used to decrease any possible bruising.  o In rare cases feeling tired or nauseous after the treatment is normal. In addition, your symptoms may get worse before they get better, this period will typically not last longer than 24 hours.   . What Can I do After My Treatment? o Increase your hydration by drinking more water for the next 24 hours. o You may place ice or heat on the areas treated that have become sore, however, do not use heat on inflamed or bruised areas. Heat often brings more relief post needling. o You can continue your regular activities, but vigorous activity is not recommended initially after the treatment for 24 hours. o DN is  best combined with other physical therapy such as strengthening, stretching, and other therapies.

## 2016-11-08 NOTE — Therapy (Signed)
[facility] [address] [place], [place], [address] Phone: [phone]   Fax:  [phone]  Physical Therapy Evaluation  Patient Details  Name: Caitlin Thomas MRN: [id number] Date of Birth: [birth year] Referring Provider: Dr. Earley Favor   Encounter Date: 11/08/2016  PT End of Session - 11/08/16 1011    Visit Number  1    Number of Visits  12    Date for PT Re-Evaluation  12/20/16    Authorization Type  [employer] $0 copay; 30 visit limit    PT Start Time  0927    PT Stop Time  1007    PT Time Calculation (min)  40 min    Activity Tolerance  Patient tolerated treatment well    Behavior During Therapy  [facility] for tasks assessed/performed       Past Medical History:  Diagnosis Date  . History of left breast cancer 2009   unclear hx of other treatments (dx'd and treated in [place])---"5 yrs of medication, then stopped".  No radiation.  . Prediabetes 10/2016   A1c 6.0%--dietary advice given    Past Surgical History:  Procedure Laterality Date  . COLONOSCOPY  04/16/2013   Normal per pt: no records b/c it was done in [place]. [place]  . ESOPHAGOGASTRODUODENOSCOPY  09/04/2002   Anemia, wt Thomas, abd pain---NORMAL (Dr. Victorino Dike)  . MASTECTOMY Left 2009   done in [place] for left breast cancer    There were no vitals filed for this visit.   Subjective Assessment - 11/08/16 0931    Subjective  Pt is a 56 y/o female who presents to OPPT for Lt sided LBP radiating into LLE with recently developed Rt sided LBP into buttock only.  Pt reports pain began ~ 4 months ago and unable to recall any injury to area.    Patient is accompained by:  Interpreter    Limitations  Lifting;House hold activities;Sitting;Walking    How long can you sit comfortably?  > 1 hour    How long can you walk comfortably?  5-10 min    Patient Stated Goals  improve pain, walk for longer periods    Currently in Pain?  Yes    Pain Score  5  up to 7/10   up to 7/10   Pain Orientation  Lower;Mid;Left;Right Lt>Rt   Lt>Rt   Pain Descriptors / Indicators  Squeezing;Tingling;Throbbing    Pain Type  Chronic pain;Acute pain    Pain Radiating Towards  LLE-tingling    Pain Onset  More than a month ago    Pain Frequency  Constant    Aggravating Factors   standing, walking    Pain Relieving Factors  massage, heat         [facility] PT Assessment - 11/08/16 0939      Assessment   Medical Diagnosis  LBP    Referring Provider  Dr. Earley Favor    Onset Date/Surgical Date  -- 4 months   4 months   Next MD Visit  12/14/16    Prior Therapy  n/a      Precautions   Precautions  None      Restrictions   Weight Bearing Restrictions  No      Balance Screen   Has the patient fallen in the past 6 months  No    Has the patient had a decrease in activity level because of a fear of falling?   No    Is the patient reluctant  to leave their home because of a fear of falling?   No      Home Environment   Living Environment  Private residence    Living Arrangements  Spouse/significant other    Type of Home  House    Home Access  Level entry    Home Layout  One level      Prior Function   Level of Independence  Independent    Vocation  Part time employment    Vocation Requirements  [employer] - sitting most days doing pick ups; light lifting    Leisure  golf, swimming - unable at this time      Cognition   Overall Cognitive Status  Within Functional Limits for tasks assessed      Posture/Postural Control   Posture/Postural Control  Postural limitations    Postural Limitations  Decreased lumbar lordosis      ROM / Strength   AROM / PROM / Strength  AROM;Strength      AROM   AROM Assessment Site  Lumbar    Lumbar Flexion  97 ~ 30 degrees knee flexion   ~ 30 degrees knee flexion   Lumbar Extension  20    Lumbar - Right Side Bend  34 pain on Lt   pain on Lt   Lumbar - Left Side Bend  32      Strength   Strength Assessment Site  Hip;Knee;Ankle     Right/Left Hip  Right;Left    Right Hip Flexion  5/5    Right Hip Extension  3/5    Right Hip ABduction  4/5    Left Hip Flexion  5/5    Left Hip Extension  3/5    Left Hip ABduction  4/5    Right/Left Knee  Right;Left    Right Knee Extension  5/5    Left Knee Extension  4/5    Right/Left Ankle  Right;Left    Right Ankle Dorsiflexion  5/5    Left Ankle Dorsiflexion  5/5      Flexibility   Soft Tissue Assessment /Muscle Length  yes    Hamstrings  tightness Lt>Rt    Piriformis  tightness Lt>Rt      Palpation   Spinal mobility  hypomobile lumbar spine    Palpation comment  trigger points and tenderness noted along glute med/max as well as pt c/o pain deep into piriformis      Special Tests    Special Tests  Lumbar;Sacrolliac Tests    Lumbar Tests  Straight Leg Raise    Sacroiliac Tests   Sacral Thrust      Straight Leg Raise   Findings  Negative      Sacral thrust    Findings  Negative      Ambulation/Gait   Gait Comments  independent             Objective measurements completed on examination: See above findings.      [facility] - 11/08/16 0939      Exercises   Exercises  Lumbar      Lumbar Exercises: Stretches   Piriformis Stretch  1 rep;30 seconds    Piriformis Stretch Limitations  for HEP instruction             PT Education - 11/08/16 1011    Education provided  Yes    Education Details  piriformis stretch, DN    Person(s) Educated  Patient    Methods  Explanation;Demonstration;Handout  Comprehension  Verbalized understanding;Returned demonstration;Need further instruction          PT Long Term Goals - 11/08/16 1252      PT LONG TERM GOAL #1   Title  independent with HEP    Status  New    Target Date  12/20/16      PT LONG TERM GOAL #2   Title  demonstrate proper techniques with job responsibilities to decrease risk of reinjury    Status  New    Target Date  12/20/16      PT LONG TERM GOAL #3   Title   report ability to walk > 20 min without increase in pain for improved function    Status  New    Target Date  12/20/16      PT LONG TERM GOAL #4   Title  report centralization of symptoms for improved function and pain    Status  New    Target Date  12/20/16             Plan - 11/08/16 1248    Clinical Impression Statement  Pt is a 56 y/o female who presents to OPPT for LBP with Lt sided radicular symptoms and newly started Rt side symptoms.  Pt demonstrates mild strength deficits, active trigger points and decreased flexibility affecting functional mobility.  Limited trial of extension based exercises did not change symptoms, but pt with significant pain in glute med/max as well as piriformis.  Will benefit from PT to maximize function.     Clinical Presentation  Stable    Clinical Decision Making  Low    Rehab Potential  Good    PT Frequency  2x / week    PT Duration  6 weeks    PT Treatment/Interventions  ADLs/Self Care Home Management;Cryotherapy;Electrical Stimulation;Moist Heat;Therapeutic exercise;Therapeutic activities;Functional mobility training;Neuromuscular re-education;Patient/family education;Manual techniques;Taping;Dry needling    PT Next Visit Plan  review piriformis stretch; add additional stretches, try DN if pt agrees    Consulted and Agree with Plan of Care  Patient       Patient will benefit from skilled therapeutic intervention in order to improve the following deficits and impairments:  Increased fascial restricitons, Increased muscle spasms, Decreased strength, Decreased mobility, Pain, Postural dysfunction, Impaired flexibility, Hypomobility  Visit Diagnosis: Acute bilateral low back pain with bilateral sciatica - Plan: PT plan of care cert/re-cert  Abnormal posture - Plan: PT plan of care cert/re-cert     Problem List Patient Active Problem List   Diagnosis Date Noted  . ANEMIA 08/12/2007  . DIZZINESS 08/12/2007      Clarita CraneStephanie F Taran Haynesworth,  PT, DPT 11/08/16 12:55 PM     [facility] [address] [address] [place], [place], [id number] Phone: [phone]   Fax:  [phone]  Name: Caitlin Thomas MRN: [id number] Date of Birth: 1960-12-16

## 2016-11-14 ENCOUNTER — Encounter: Payer: Self-pay | Admitting: *Deleted

## 2016-11-15 ENCOUNTER — Telehealth: Payer: Self-pay | Admitting: *Deleted

## 2016-11-16 ENCOUNTER — Encounter: Payer: Self-pay | Admitting: Physical Therapy

## 2016-11-16 ENCOUNTER — Ambulatory Visit (INDEPENDENT_AMBULATORY_CARE_PROVIDER_SITE_OTHER): Payer: BLUE CROSS/BLUE SHIELD | Admitting: Physical Therapy

## 2016-11-16 ENCOUNTER — Encounter: Payer: Self-pay | Admitting: *Deleted

## 2016-11-16 DIAGNOSIS — M5442 Lumbago with sciatica, left side: Secondary | ICD-10-CM

## 2016-11-16 DIAGNOSIS — M5441 Lumbago with sciatica, right side: Secondary | ICD-10-CM

## 2016-11-16 DIAGNOSIS — R293 Abnormal posture: Secondary | ICD-10-CM | POA: Diagnosis not present

## 2016-11-16 NOTE — Patient Instructions (Signed)
Double Knee to Chest (Flexion)   Gently pull both knees toward chest. Feel stretch in lower back or buttock area. Breathing deeply, Hold _30___ seconds. Repeat __3__ times. Do _1-2___ sessions per day.  Knee to Chest (Flexion)   Pull knee toward chest. Feel stretch in lower back or buttock area. Breathing deeply, Hold __30__ seconds. Repeat with other knee. Repeat __3__ times. Do __1-2__ sessions per day.  Lumbar Rotation: Caudal - Bilateral (Supine)   Feet and knees together, arms outstretched, rock knees left and right, staying within shoulder distance ( man in picture is going too far) ,relaxing muscles of low back. Perform for 1 minute. Relax. Repeat _3___ times per set.  Do __1-2__ sessions per day.

## 2016-11-16 NOTE — Therapy (Signed)
[facility] [address] [place], [place], [address] Phone: [phone]   Fax:  [phone]  Physical Therapy Treatment  Patient Details  Name: Caitlin Thomas Demeter MRN: [id number] Date of Birth: [birth year] Referring Provider: Dr. Earley FavorPhillip McGowen   Encounter Date: 11/16/2016  PT End of Session - 11/16/16 1637    Visit Number  2    Number of Visits  12    Date for PT Re-Evaluation  12/20/16    Authorization Type  BCBS $0 copay; 30 visit limit    PT Start Time  1600    Activity Tolerance  Patient tolerated treatment well    Behavior During Therapy  [facility] for tasks assessed/performed       Past Medical History:  Diagnosis Date  . History of left breast cancer 2009   unclear hx of other treatments (dx'd and [place] in [place]---"5 yrs of medication, then stopped".  No radiation.  . Prediabetes 10/2016   A1c 6.0%--dietary advice given    Past Surgical History:  Procedure Laterality Date  . COLONOSCOPY  04/16/2013   Normal per pt: no records [place]  . ESOPHAGOGASTRODUODENOSCOPY  09/04/2002   Anemia, wt loss, abd pain---NORMAL (Dr. Victorino DikeSam Zap)  . MASTECTOMY Left 2009   [place]    There were no vitals filed for this visit.  Subjective Assessment - 11/16/16 1602    Subjective  back feels about the same.  doing piriformis stretch, but no change yet.    Patient is accompained by:  Interpreter    Patient Stated Goals  improve pain, walk for longer periods    Currently in Pain?  Yes    Pain Score  7     Pain Orientation  Lower;Mid;Left;Right Lt>Rt    Pain Descriptors / Indicators  Squeezing;Tingling;Throbbing    Pain Type  Chronic pain;Acute pain    Pain Radiating Towards  LLE tingling    Pain Onset  More than a month ago    Pain Frequency  Constant    Aggravating Factors   standing, walking    Pain Relieving Factors  massage, heat                      [facility]  Adult PT Treatment/Exercise - 11/16/16 1604      Lumbar Exercises: Stretches   Single Knee to Chest Stretch  2 reps;30 seconds    Double Knee to Chest Stretch  2 reps;30 seconds    Lower Trunk Rotation  2 reps;30 seconds    Piriformis Stretch  2 reps;30 seconds      Lumbar Exercises: Aerobic   Stationary Bike  L2 x 5 min      Manual Therapy   Manual Therapy  Soft tissue mobilization    Soft tissue mobilization  IASTM to Lt glutes       Trigger Point Dry Needling - 11/16/16 1633    Consent Given?  Yes    Education Handout Provided  Yes    Muscles Treated Lower Body  Gluteus maximus;Piriformis Lt    Gluteus Maximus Response  Twitch response elicited;Palpable increased muscle length    Piriformis Response  Twitch response elicited;Palpable increased muscle length           PT Education - 11/16/16 1637    Education provided  Yes    Education Details  added to [employer]    Person(s) Educated  Patient  Methods  Explanation;Demonstration;Handout    Comprehension  Verbalized understanding;Returned demonstration;Need further instruction          PT Long Term Goals - 11/08/16 1252      PT LONG TERM GOAL #1   Title  independent with HEP    Status  New    Target Date  12/20/16      PT LONG TERM GOAL #2   Title  demonstrate proper techniques with job responsibilities to decrease risk of reinjury    Status  New    Target Date  12/20/16      PT LONG TERM GOAL #3   Title  report ability to walk > 20 min without increase in pain for improved function    Status  New    Target Date  12/20/16      PT LONG TERM GOAL #4   Title  report centralization of symptoms for improved function and pain    Status  New    Target Date  12/20/16            Plan - 11/16/16 1643    Clinical Impression Statement  Pt tolerated DN well with twitch responses noted in piriformis and glute max on Lt side.  Pt reported decreased tightness and pain following DN and manual therapy.  Pt will  continue to benefit from PT to maximize function.    PT Treatment/Interventions  ADLs/Self Care Home Management;Cryotherapy;Electrical Stimulation;Moist Heat;Therapeutic exercise;Therapeutic activities;Functional mobility training;Neuromuscular re-education;Patient/family education;Manual techniques;Taping;Dry needling    PT Next Visit Plan  review HEP, assess response to DN, perform on Rt side if needed.  continue per POC    Consulted and Agree with Plan of Care  Patient       Patient will benefit from skilled therapeutic intervention in order to improve the following deficits and impairments:  Increased fascial restricitons, Increased muscle spasms, Decreased strength, Decreased mobility, Pain, Postural dysfunction, Impaired flexibility, Hypomobility  Visit Diagnosis: Acute bilateral low back pain with bilateral sciatica  Abnormal posture     Problem List Patient Active Problem List   Diagnosis Date Noted  . ANEMIA 08/12/2007  . DIZZINESS 08/12/2007      Clarita CraneStephanie F Evamae Rowen, PT, DPT 11/16/16 4:45 PM    [facility] [address] [place], [place], [address] Phone: [phone]   Fax:  [phone]  Name: Caitlin Thomas Woodring MRN: [id number] Date of Birth: 06/25/1960

## 2016-11-18 NOTE — Congregational Nurse Program (Unsigned)
Congregational Nurse Program Note  Date of Encounter: 11/16/2016  Past Medical History: Past Medical History:  Diagnosis Date  . History of left breast cancer 2009   unclear hx of other treatments (dx[place] and [place] in [place]---"5 yrs of medication, then stopped".  No radiation.  . Prediabetes 10/2016   A1c 6.0%--dietary advice given    Encounter Details: CNP Questionnaire - 11/16/16 1700      Questionnaire   Patient Status  Immigrant    Race  Asian    Location Patient Served At  Not Applicable rehab at [address]   rehab at [facility] [address]   [facility]    Uninsured  Not Applicable    Food  No food insecurities    Housing/Utilities  Yes, have permanent housing    Transportation  No transportation needs    Interpersonal Safety  Yes, feel physically and emotionally safe where you currently live    Medication  No medication insecurities    Medical Provider  Yes    Referrals  Not Applicable    ED Visit Averted  Not Applicable    Life-Saving Intervention Made  Not Applicable      met pt at rehab at [place], [address]. Pt had translator with her, had shot Lt hop  And physical tx..told her as long as translator arranged by facility, I will not be at her treatment. Pt will let me know the progress.

## 2016-11-19 ENCOUNTER — Encounter: Payer: Self-pay | Admitting: *Deleted

## 2016-11-19 NOTE — Congregational Nurse Program (Unsigned)
Congregational Nurse Program Note  Date of Encounter: 11/19/2016  Past Medical History: Past Medical History:  Diagnosis Date  . History of left breast cancer 2009   unclear hx of other treatments (dx'd and [place] in [place]---"5 yrs of medication, then stopped".  No radiation.  . Prediabetes 10/2016   A1c 6.0%--dietary advice given    Encounter Details: CNP Questionnaire - 11/19/16 1020      Questionnaire   Patient Status  Immigrant    Race  Asian    Location Patient Served At  Not Applicable    Insurance  Private Insurance    Uninsured  Not Applicable    Food  No food insecurities    Housing/Utilities  Yes, have permanent housing    Transportation  No transportation needs    Interpersonal Safety  Yes, feel physically and emotionally safe where you currently live    Medication  No medication insecurities    Medical Provider  Yes    Referrals  Not Applicable    ED Visit Averted  Not Applicable    Life-Saving Intervention Made  Not Applicable       Pt said no difference after 1st treatment of physical, kept doing stretches and exercise, told her keep doing and physical treatment will help.

## 2016-11-28 ENCOUNTER — Ambulatory Visit (INDEPENDENT_AMBULATORY_CARE_PROVIDER_SITE_OTHER): Payer: BLUE CROSS/BLUE SHIELD | Admitting: Physical Therapy

## 2016-11-28 ENCOUNTER — Encounter: Payer: Self-pay | Admitting: Physical Therapy

## 2016-11-28 DIAGNOSIS — M5442 Lumbago with sciatica, left side: Secondary | ICD-10-CM | POA: Diagnosis not present

## 2016-11-28 DIAGNOSIS — R293 Abnormal posture: Secondary | ICD-10-CM

## 2016-11-28 DIAGNOSIS — M5441 Lumbago with sciatica, right side: Secondary | ICD-10-CM

## 2016-11-28 NOTE — Patient Instructions (Signed)
   Elbow Prop (Extension)   Prop body up on elbows for __3-5__ minutes. Slowly lower it. Repeat __1-2__ times. Do __3-5__ sessions per day.  Extension   Lie face down, hands close to chest. Press trunk up, arching back. Keep neck long, shoulders down. Tighten buttocks to protect lower back. Hold __1-2__ seconds. Repeat __10-15__ times. Do __3-5__ sessions per day.  Backward Bend (Standing)   Arch backward to make hollow of back deeper. Hold _1-2___ seconds. Repeat __10-15__ times per set. Do __1__ sets per session. Do __3-5__ sessions per day.  Copyright  VHI. All rights reserved.    STOP THESE IF YOUR PAIN GETS WORSE.

## 2016-11-28 NOTE — Therapy (Addendum)
[facility] [address] [place], [place], [address] Phone: [phone]   Fax:  [phone]  Physical Therapy Treatment  Patient Details  Name: Caitlin Thomas MRN: [id number] Date of Birth: 03-26-1960 Referring Provider: Dr. Ricardo Jericho   Encounter Date: 11/28/2016  PT End of Session - 11/28/16 1640    Visit Number  3    Number of Visits  12    Date for PT Re-Evaluation  12/20/16    Authorization Type  BCBS $0 copay; 30 visit limit    PT Start Time  1600    PT Stop Time  1641    PT Time Calculation (min)  41 min    Activity Tolerance  Patient tolerated treatment well    Behavior During Therapy  [facility] for tasks assessed/performed       Past Medical History:  Diagnosis Date  . History of left breast cancer 2009   unclear hx of other treatments (dx'd and [place] in [place]---"5 yrs of medication, then stopped".  No radiation.  . Prediabetes 10/2016   A1c 6.0%--dietary advice given    Past Surgical History:  Procedure Laterality Date  . COLONOSCOPY  04/16/2013   Normal per pt: no records b/c it was done in [place].  . ESOPHAGOGASTRODUODENOSCOPY  09/04/2002   Anemia, wt loss, abd pain---NORMAL (Dr. Lyla Son)  . MASTECTOMY Left 2009   done in [place] for left breast cancer    There were no vitals filed for this visit.  Subjective Assessment - 11/28/16 1601    Subjective  DN didn't help, feels worse today and pain seems to be going down into Lt lower leg and foot.  reports pain worsened yesterday.  no changes to bowel/bladder    Patient is accompained by:  Interpreter    Patient Stated Goals  improve pain, walk for longer periods    Currently in Pain?  Yes    Pain Score  10-Worst pain ever    Pain Location  Back    Pain Orientation  Lower;Mid;Left;Right    Pain Descriptors / Indicators  Squeezing;Tingling;Throbbing    Pain Type  Chronic pain;Acute pain    Pain Radiating Towards  LLE tingling    Pain Onset  More  than a month ago    Pain Frequency  Constant    Aggravating Factors   standing, walking    Pain Relieving Factors  massage, heat                      [facility] Adult PT Treatment/Exercise - 11/28/16 1610      Self-Care   Self-Care  Other Self-Care Comments    Other Self-Care Comments   discussed current progress and worsening of symptoms; shifted focus today to extension based activities with min decrease in tingling but no change in pain.  Recommended pt try exercises and if pain continues to be elevated or gets worse to call MD to follow up on next steps.  Pt verbalized understanding; and PT will send update to congressional nurse as well.      Lumbar Exercises: Stretches   Single Knee to Chest Stretch  2 reps;30 seconds    Standing Extension  -- x15 reps    Prone on Elbows Stretch  3 reps;60 seconds consecutive      Lumbar Exercises: Supine   Bridge  5 reps;5 seconds no change in pain    Other Supine Lumbar Exercises  isometric hip abduction bil 5x5  sec without change in symptoms             PT Education - 11/28/16 1640    Education provided  Yes    Education Details  extension based HEP; see self care    Person(s) Educated  Patient    Methods  Explanation;Demonstration;Handout    Comprehension  Verbalized understanding;Returned demonstration          PT Long Term Goals - 11/08/16 1252      PT LONG TERM GOAL #1   Title  independent with HEP    Status  New    Target Date  12/20/16      PT LONG TERM GOAL #2   Title  demonstrate proper techniques with job responsibilities to decrease risk of reinjury    Status  New    Target Date  12/20/16      PT LONG TERM GOAL #3   Title  report ability to walk > 20 min without increase in pain for improved function    Status  New    Target Date  12/20/16      PT LONG TERM GOAL #4   Title  report centralization of symptoms for improved function and pain    Status  New    Target Date  12/20/16             Plan - 11/28/16 1641    Clinical Impression Statement  Pt reports no change in pain following DN and actually states pain has increased and symptoms have worsened since yesterday.  Switched to extension based HEP to see if symptoms improve.  Recommended if no change or worsening symptoms to follow up with MD.    PT Treatment/Interventions  ADLs/Self Care Home Management;Cryotherapy;Electrical Stimulation;Moist Heat;Therapeutic exercise;Therapeutic activities;Functional mobility training;Neuromuscular re-education;Patient/family education;Manual techniques;Taping;Dry needling    PT Next Visit Plan  review HEP, assess response to DN, perform on Rt side if needed.  continue per POC    Consulted and Agree with Plan of Care  Patient       Patient will benefit from skilled therapeutic intervention in order to improve the following deficits and impairments:  Increased fascial restricitons, Increased muscle spasms, Decreased strength, Decreased mobility, Pain, Postural dysfunction, Impaired flexibility, Hypomobility  Visit Diagnosis: Acute bilateral low back pain with bilateral sciatica  Abnormal posture     Problem List Patient Active Problem List   Diagnosis Date Noted  . ANEMIA 08/12/2007  . DIZZINESS 08/12/2007      Laureen Abrahams, PT, DPT 11/28/16 4:43 PM     [place], [place], [address] Phone: [phone]   Fax:  [phone]  Name: Caitlin Thomas MRN: [id number] Date of Birth: 1960/11/23  PHYSICAL THERAPY DISCHARGE SUMMARY  Visits from Start of Care: 3   Plan: Patient agrees to discharge.  Patient goals were not met. Patient is being discharged due to not returning since the last visit.  ?????      Lyndee Hensen, PT, DPT 3:34 PM  02/01/17

## 2016-11-29 ENCOUNTER — Ambulatory Visit (HOSPITAL_BASED_OUTPATIENT_CLINIC_OR_DEPARTMENT_OTHER)
Admission: RE | Admit: 2016-11-29 | Discharge: 2016-11-29 | Disposition: A | Discharge status: Home / Self Care | Payer: BLUE CROSS/BLUE SHIELD | Source: Ambulatory Visit | Attending: Family Medicine | Admitting: Family Medicine

## 2016-11-29 ENCOUNTER — Encounter: Payer: Self-pay | Admitting: Family Medicine

## 2016-11-29 ENCOUNTER — Ambulatory Visit (INDEPENDENT_AMBULATORY_CARE_PROVIDER_SITE_OTHER): Payer: BLUE CROSS/BLUE SHIELD | Admitting: Family Medicine

## 2016-11-29 VITALS — BP 133/84 | HR 69 | Temp 98.0°F | Resp 16 | Ht 63.75 in | Wt 139.0 lb

## 2016-11-29 DIAGNOSIS — M25552 Pain in left hip: Secondary | ICD-10-CM | POA: Diagnosis not present

## 2016-11-29 DIAGNOSIS — M25551 Pain in right hip: Secondary | ICD-10-CM | POA: Diagnosis not present

## 2016-11-29 DIAGNOSIS — M5441 Lumbago with sciatica, right side: Secondary | ICD-10-CM | POA: Diagnosis not present

## 2016-11-29 DIAGNOSIS — M5442 Lumbago with sciatica, left side: Secondary | ICD-10-CM

## 2016-11-29 DIAGNOSIS — M545 Low back pain: Secondary | ICD-10-CM | POA: Diagnosis not present

## 2016-11-29 DIAGNOSIS — M65251 Calcific tendinitis, right thigh: Secondary | ICD-10-CM | POA: Diagnosis not present

## 2016-11-29 MED ORDER — HYDROCODONE-ACETAMINOPHEN 5-325 MG PO TABS: ORAL_TABLET | ORAL | 0 refills | Status: DC

## 2016-11-29 NOTE — Progress Notes (Signed)
OFFICE VISIT  11/29/2016   CC:  Chief Complaint  Patient presents with  . Hip Pain    both sides, pain goes down both leg, left worse than right   HPI:    Patient is a 56 y.o. Asian female who presents with interpreter today for f/u pain in left hip and leg that I dx'd as sciatica. Has had sx's at least 2 months now. No improvement with prednisone and home PT. Then I referred her for formal PT and started her on gabapentin. She has participated in PT for the last 1 mo.  Says pain is even worse.  Left low back/gluteal region--rad down L leg, same on R but less intense.  Tingling sensation in lateral ankles/feet.  Describes pain as "tearing".   Gabapentin not helping, only causing mild drowsiness at this time.  Had too much dizziness with 2 tabs tid, so she currently takes 1-2 tid depending on how she feels. Bladder and bowel control: intact.  Past Medical History:  Diagnosis Date  . History of left breast cancer 2009   unclear hx of other treatments (dx'd and [place] in [place])---"5 yrs of medication, then stopped".  No radiation.  . Prediabetes 10/2016   A1c 6.0%--dietary advice given    Past Surgical History:  Procedure Laterality Date  . COLONOSCOPY  04/16/2013   Normal per pt: no records [place]  . ESOPHAGOGASTRODUODENOSCOPY  09/04/2002   Anemia, wt loss, abd pain---NORMAL (Dr. Victorino DikeSam [place])  . MASTECTOMY Left 2009   [place]    Outpatient Medications Prior to Visit  Medication Sig Dispense Refill  . gabapentin (NEURONTIN) 100 MG capsule 2 tabs po tid 180 capsule 0   No facility-administered medications prior to visit.     No Known Allergies  ROS As per HPI  PE: Blood pressure 133/84, pulse 69, temperature 98 F (36.7 C), temperature source Oral, resp. rate 16, height 5' 3.75" (1.619 m), weight 139 lb (63 kg), SpO2 95 %. Gen: Alert, well appearing.  Patient is oriented to person, place, time, and  situation. AFFECT: pleasant, lucid thought and speech. Pain with getting up from chair and with getting onto exam table. LB: no TTP.  ROM intact but some pain with flexion. No SI joint tenderness. She has significant tenderness to palpation in gluteal region over/around ischial tuberosity. Sitting SLR: equivocal bilat. LE strength 5/5 prox/dist bilat.  DTRs: 1+ patellar bilat, absent in achilles bilat.  LABS:    Chemistry      Component Value Date/Time   NA 138 08/07/2016 1443   K 4.3 08/07/2016 1443   CL 103 08/07/2016 1443   CO2 28 08/07/2016 1443   BUN 18 08/07/2016 1443   CREATININE 0.63 08/07/2016 1443      Component Value Date/Time   CALCIUM 9.3 08/07/2016 1443   ALKPHOS 58 08/07/2016 1443   AST 18 08/07/2016 1443   ALT 18 08/07/2016 1443   BILITOT 0.5 08/07/2016 1443      IMPRESSION AND PLAN:  Subacute LBP/gluteal pain with bilat radiculopathy. Intractable piriformis syndrome bilat??  L-spine DDD/spondylosis with spinal nerve impingement?? She is getting worse despite adequate trial of conservative therapies.  PLAN: X-rays L spine and hips, vicodin 5/325, 1-2 tid prn, #60, stop gabapentin,+ refer to ortho.  An After Visit Summary was printed and given to the patient.  FOLLOW UP: Return for f/u to be determined based on results of specialist eval/mgmt.  Signed:  Santiago BumpersPhil Eartha Vonbehren, MD           11/29/2016

## 2016-12-08 ENCOUNTER — Telehealth: Payer: Self-pay | Admitting: *Deleted

## 2016-12-14 ENCOUNTER — Ambulatory Visit: Payer: BLUE CROSS/BLUE SHIELD | Admitting: Family Medicine

## 2016-12-21 DIAGNOSIS — G8929 Other chronic pain: Secondary | ICD-10-CM | POA: Diagnosis not present

## 2016-12-21 DIAGNOSIS — M5442 Lumbago with sciatica, left side: Secondary | ICD-10-CM | POA: Diagnosis not present

## 2016-12-25 ENCOUNTER — Telehealth: Payer: Self-pay | Admitting: *Deleted

## 2016-12-30 DIAGNOSIS — G8929 Other chronic pain: Secondary | ICD-10-CM | POA: Diagnosis not present

## 2016-12-30 DIAGNOSIS — M5442 Lumbago with sciatica, left side: Secondary | ICD-10-CM | POA: Diagnosis not present

## 2017-01-07 ENCOUNTER — Encounter: Payer: Self-pay | Admitting: Family Medicine

## 2017-01-08 ENCOUNTER — Encounter: Payer: Self-pay | Admitting: Family Medicine

## 2017-01-08 DIAGNOSIS — M48061 Spinal stenosis, lumbar region without neurogenic claudication: Secondary | ICD-10-CM | POA: Diagnosis not present

## 2017-01-12 ENCOUNTER — Other Ambulatory Visit: Payer: Self-pay | Admitting: Orthopedic Surgery

## 2017-01-12 DIAGNOSIS — M48061 Spinal stenosis, lumbar region without neurogenic claudication: Secondary | ICD-10-CM

## 2017-01-14 ENCOUNTER — Encounter: Payer: Self-pay | Admitting: *Deleted

## 2017-01-14 NOTE — Congregational Nurse Program (Signed)
Congregational Nurse Program Note  Date of Encounter: 01/14/2017  Past Medical History: Past Medical History:  Diagnosis Date  . Chronic bilateral low back pain with left-sided sciatica Fall/winter 2018   Dr. Darrelyn HillockGioffre to get MRI 01/2017-results not noted in office note 01/08/17.  Next test ordered is CT myelogram  . History of left breast cancer 2009   unclear hx of other treatments (dx'd and [place] in [place])---"5 yrs of medication, then stopped".  No radiation.  . Prediabetes 10/2016   A1c 6.0%--dietary advice given    Encounter Details: CNP Questionnaire - 01/14/17 2021      Questionnaire   Patient Status  Immigrant    Race  Asian    [facility]   Insurance  Private Insurance    Uninsured  Not Applicable    Food  No food insecurities    Housing/Utilities  Yes, have permanent housing    Transportation  No transportation needs    Interpersonal Safety  Yes, feel physically and emotionally safe where you currently live    Medication  No medication insecurities    Medical Provider  Yes    Referrals  Not Applicable    ED Visit Averted  Not Applicable    Life-Saving Intervention Made  Not Applicable      pt was f/u with orthopedic Dr .said that MRI was negative, need CT scan . Pt was suffering sciatica, wanted cancel CT . [place] planned out of country. Encouraged stretching when she got information by PT therapy.

## 2017-03-02 HISTORY — PX: OTHER SURGICAL HISTORY: SHX169

## 2017-10-09 ENCOUNTER — Encounter: Payer: Self-pay | Admitting: *Deleted

## 2018-05-06 IMAGING — DX DG HIP (WITH OR WITHOUT PELVIS) 3-4V BILAT
5 series · 5 of 5 positions shown · non-contrast
Comparison: None.

CLINICAL DATA: Bilateral hip pain

EXAM:
DG HIP (WITH OR WITHOUT PELVIS) 3-4V BILAT

[pelvis ap]
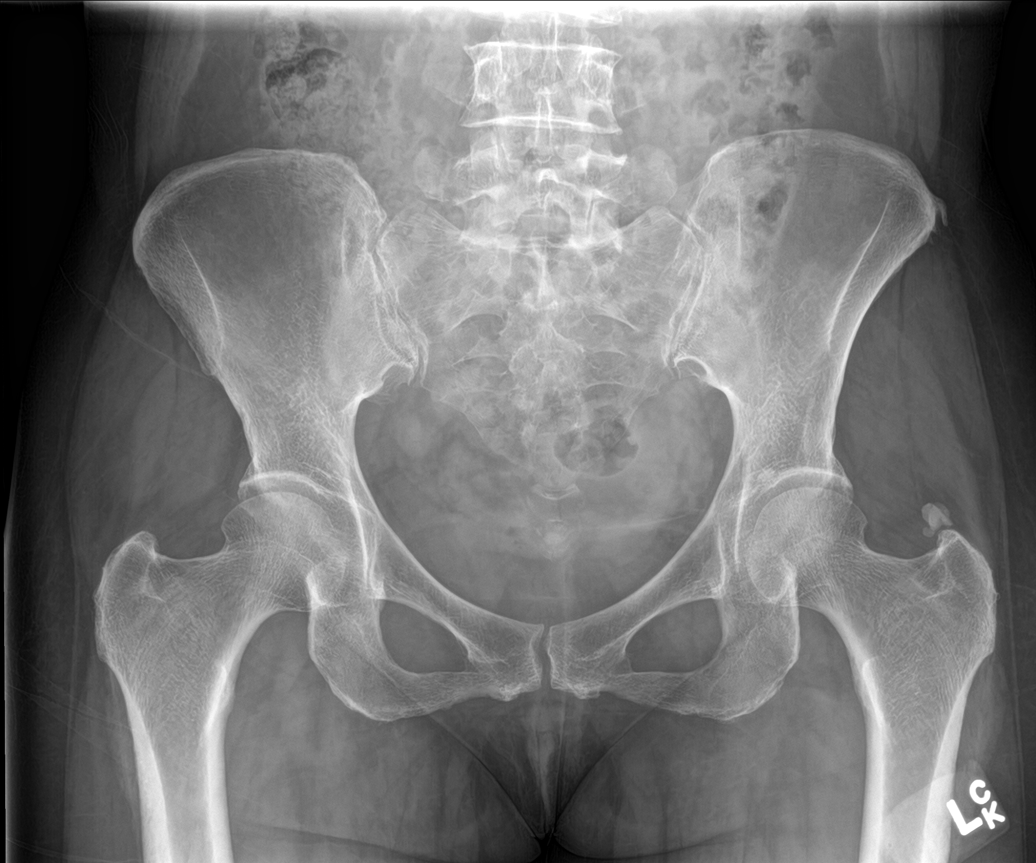

[hip ap (1 of 2)]
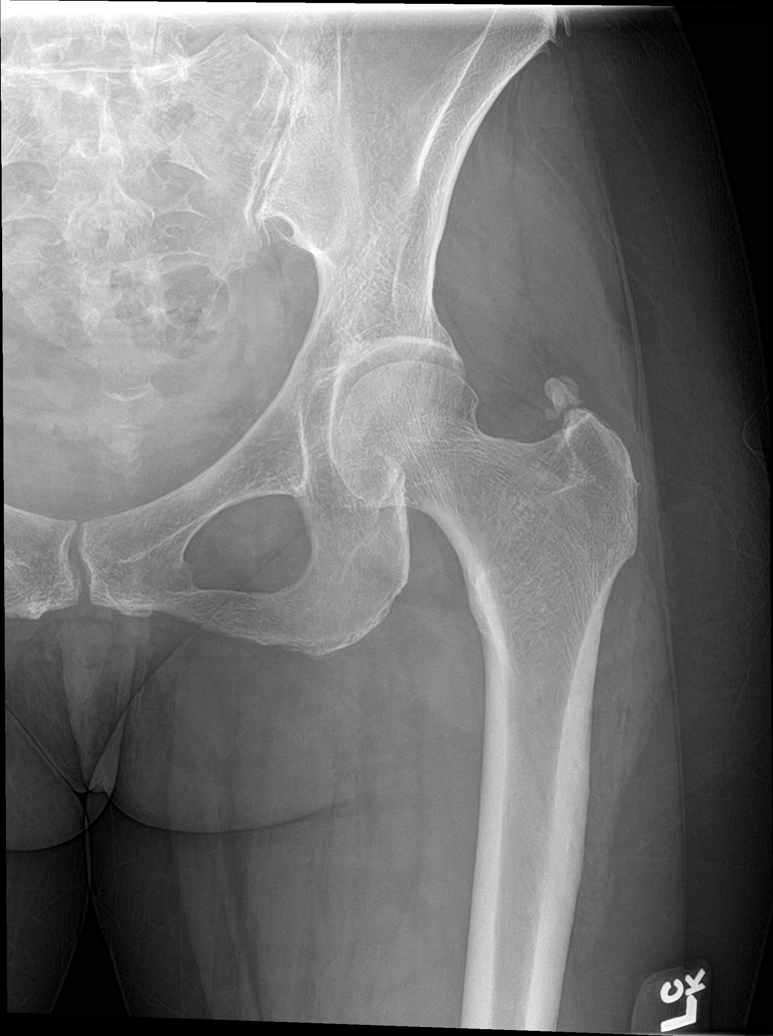

[hip ap (2 of 2)]
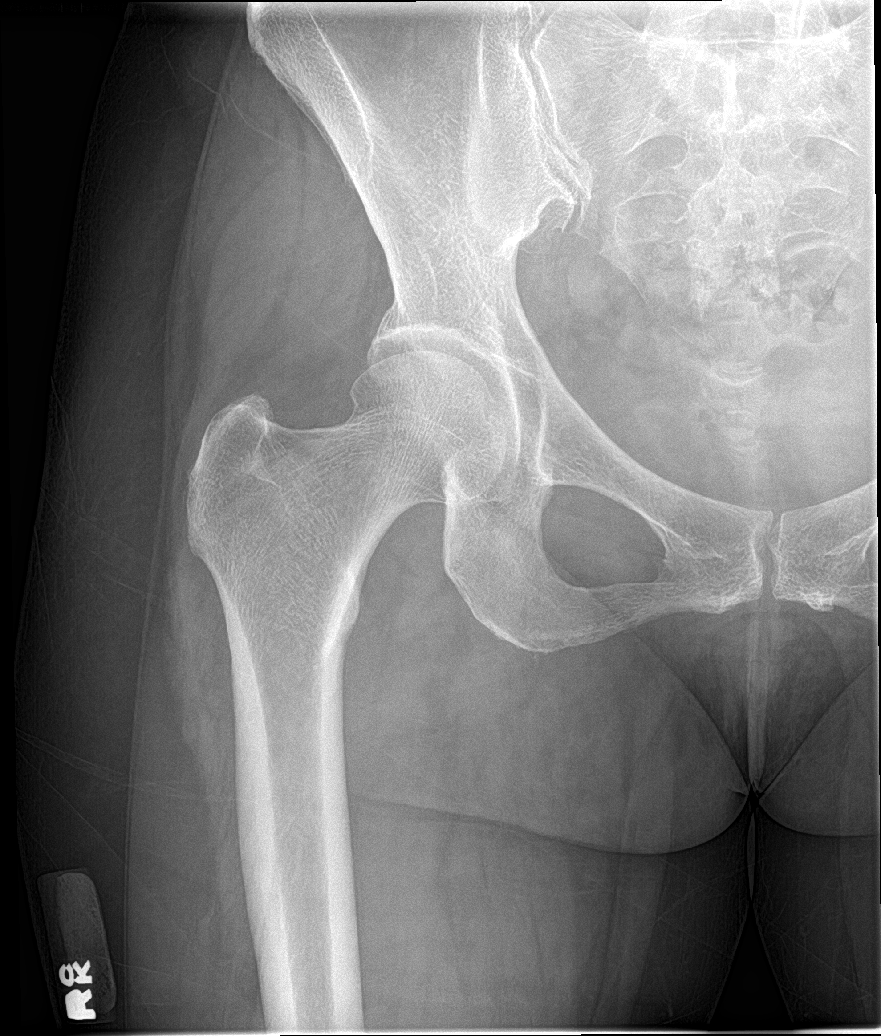

[hip lat (1 of 2)]
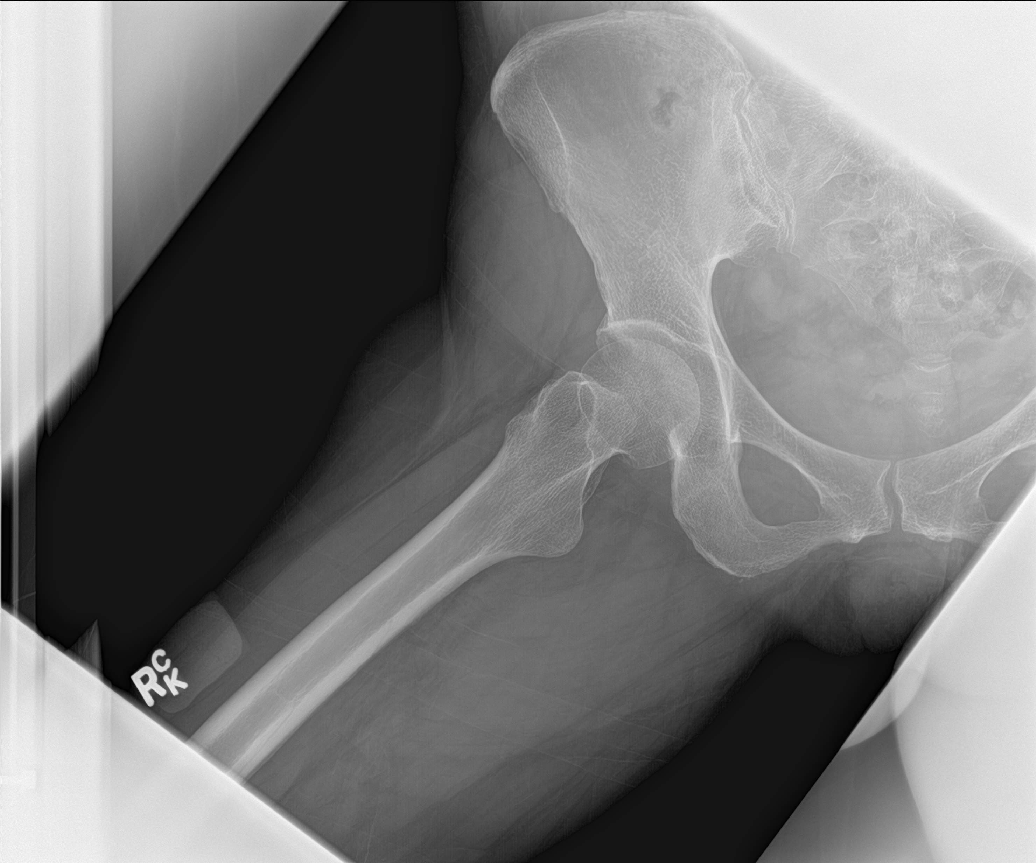

[hip lat (2 of 2)]
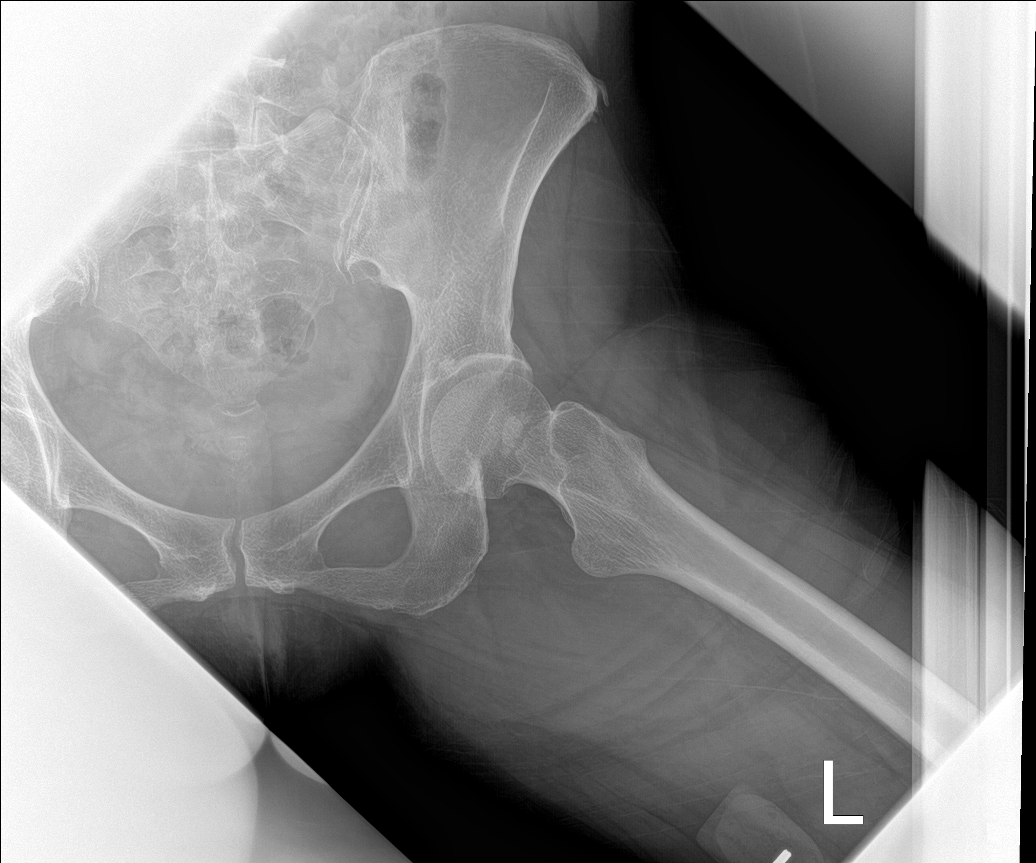

[5 of 5 positions shown; findings below may reference images not displayed]

FINDINGS: Normal hip joint space.  Negative for fracture or AVN

Globular soft tissue calcification above the greater trochanter on
the left compatible with calcific tendinitis. Negative for calcific
tendinitis on the right
IMPRESSION: No acute bony abnormality

Calcific tendinitis above the greater trochanter on the left.

## 2018-05-06 IMAGING — DX DG LUMBAR SPINE COMPLETE 4+V
5 series · 5 of 5 positions shown · non-contrast
Comparison: None.

CLINICAL DATA: Low back pain with sciatica

EXAM:
LUMBAR SPINE - COMPLETE 4+ VIEW

[l-spine ap]
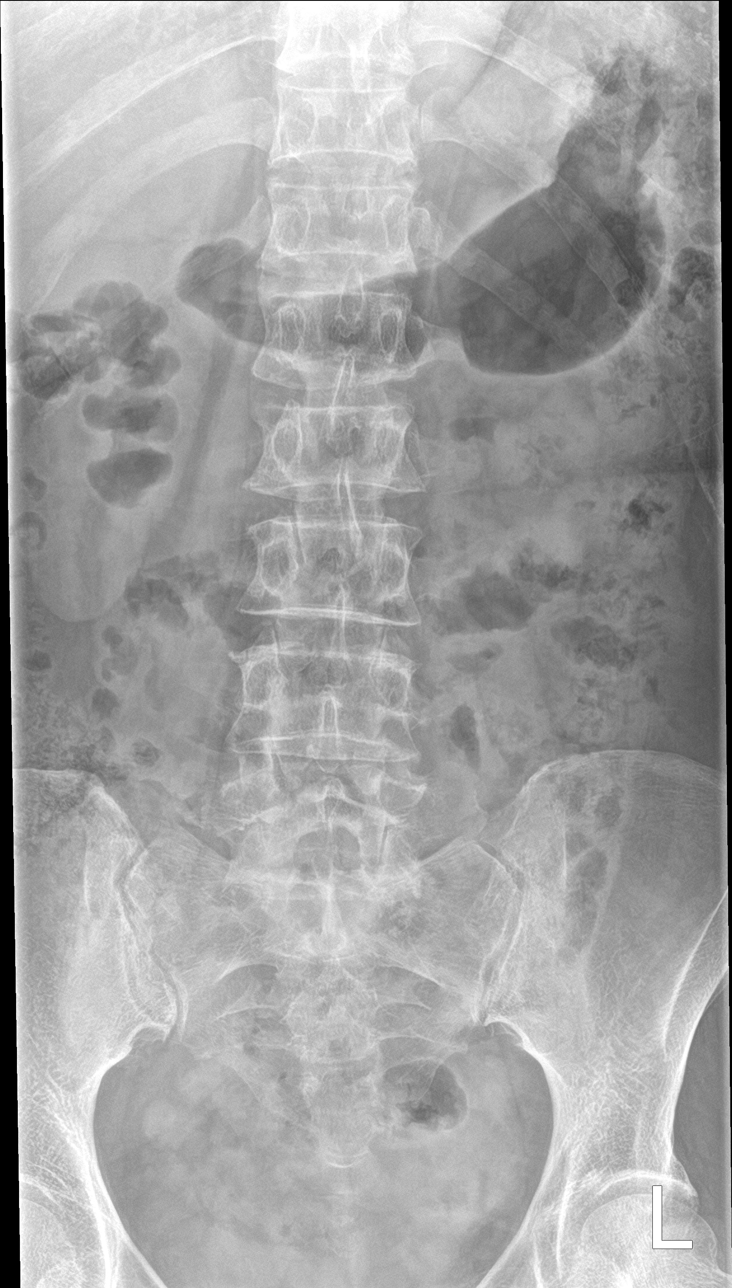

[l-spine obl (1 of 2)]
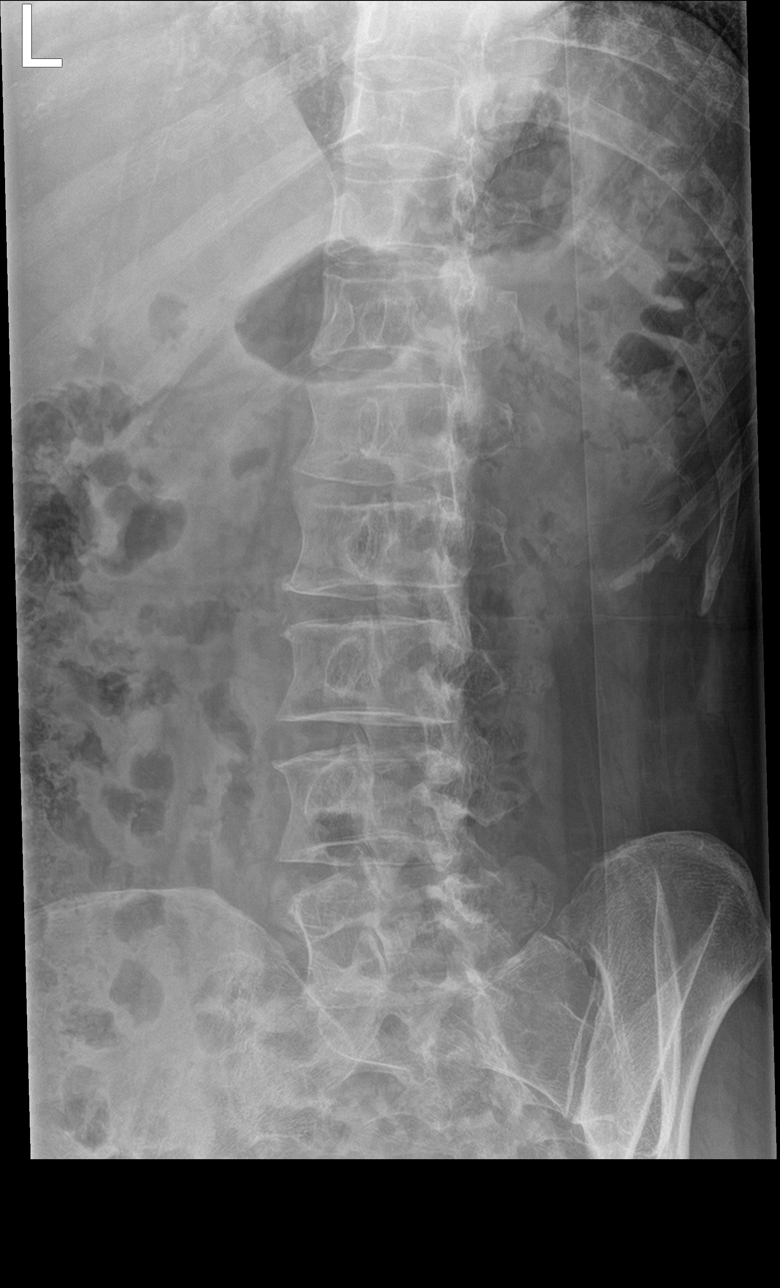

[l-spine obl (2 of 2)]
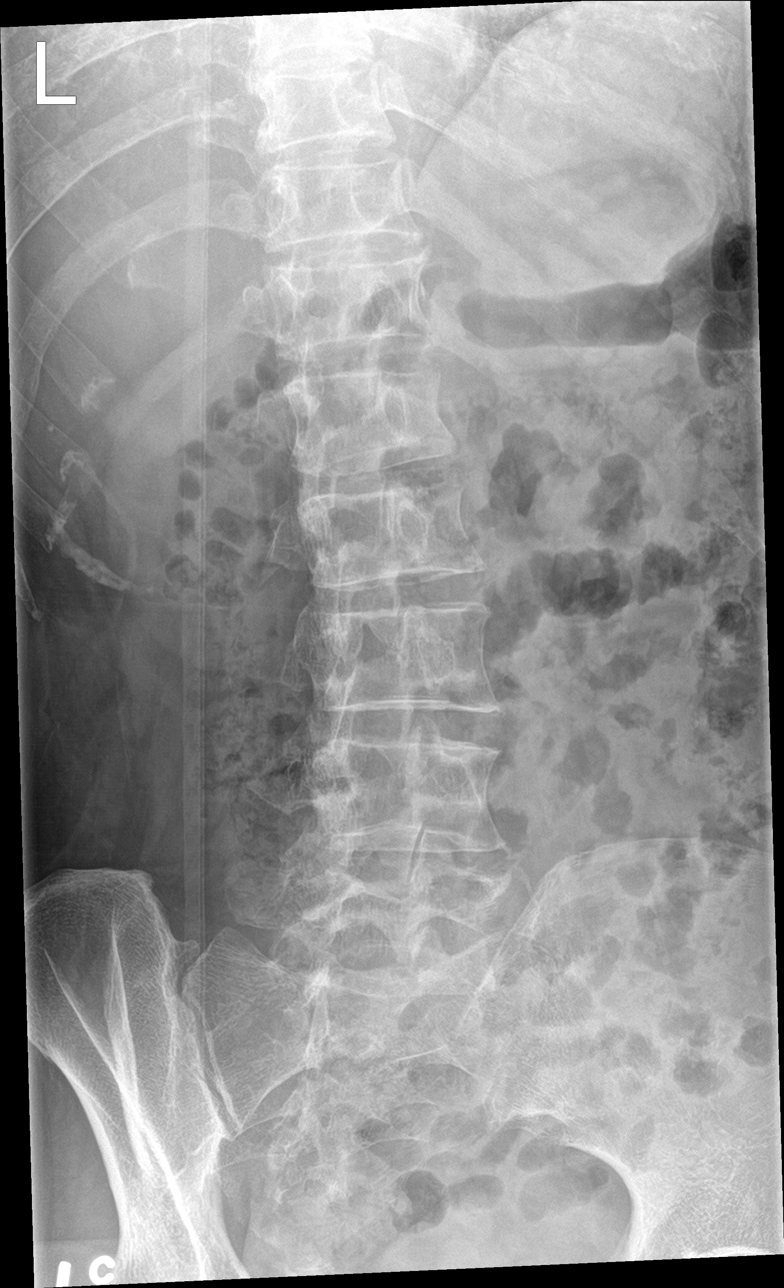

[l-spine lat]
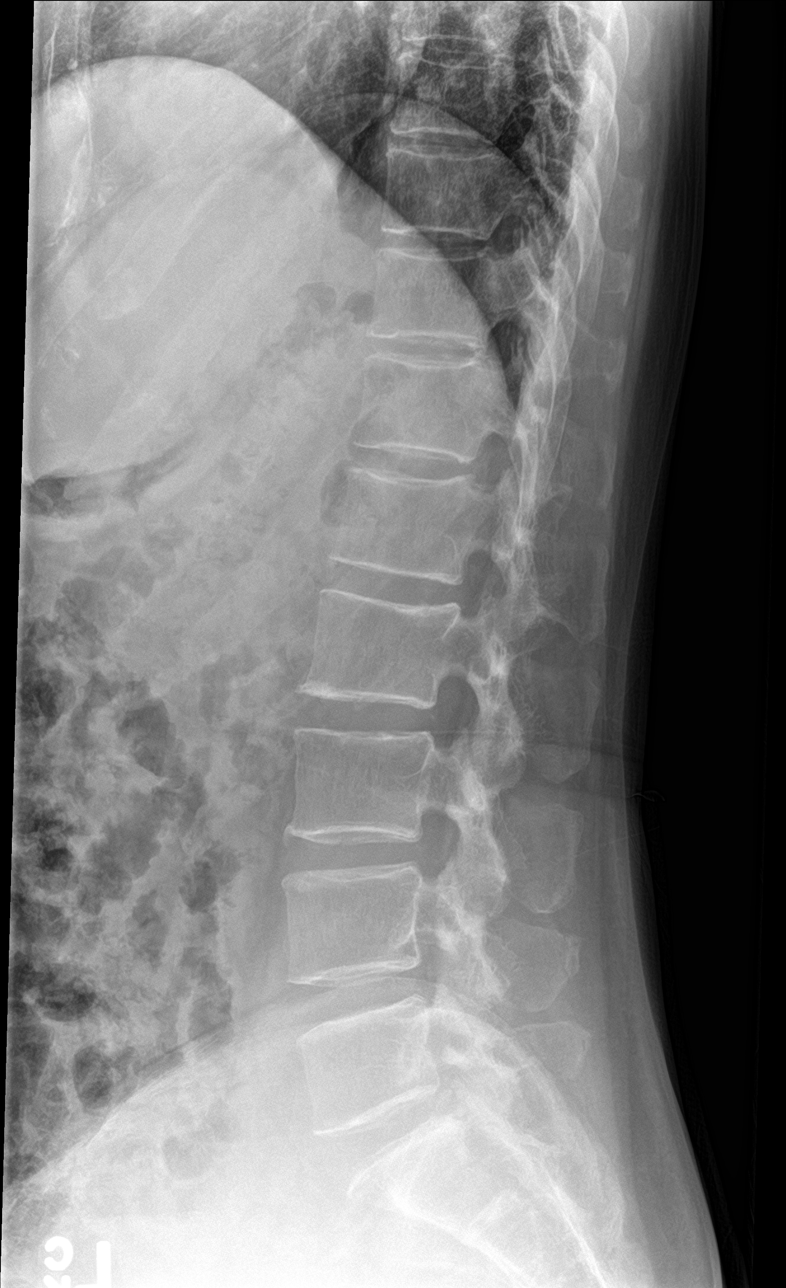

[l-spine spot]
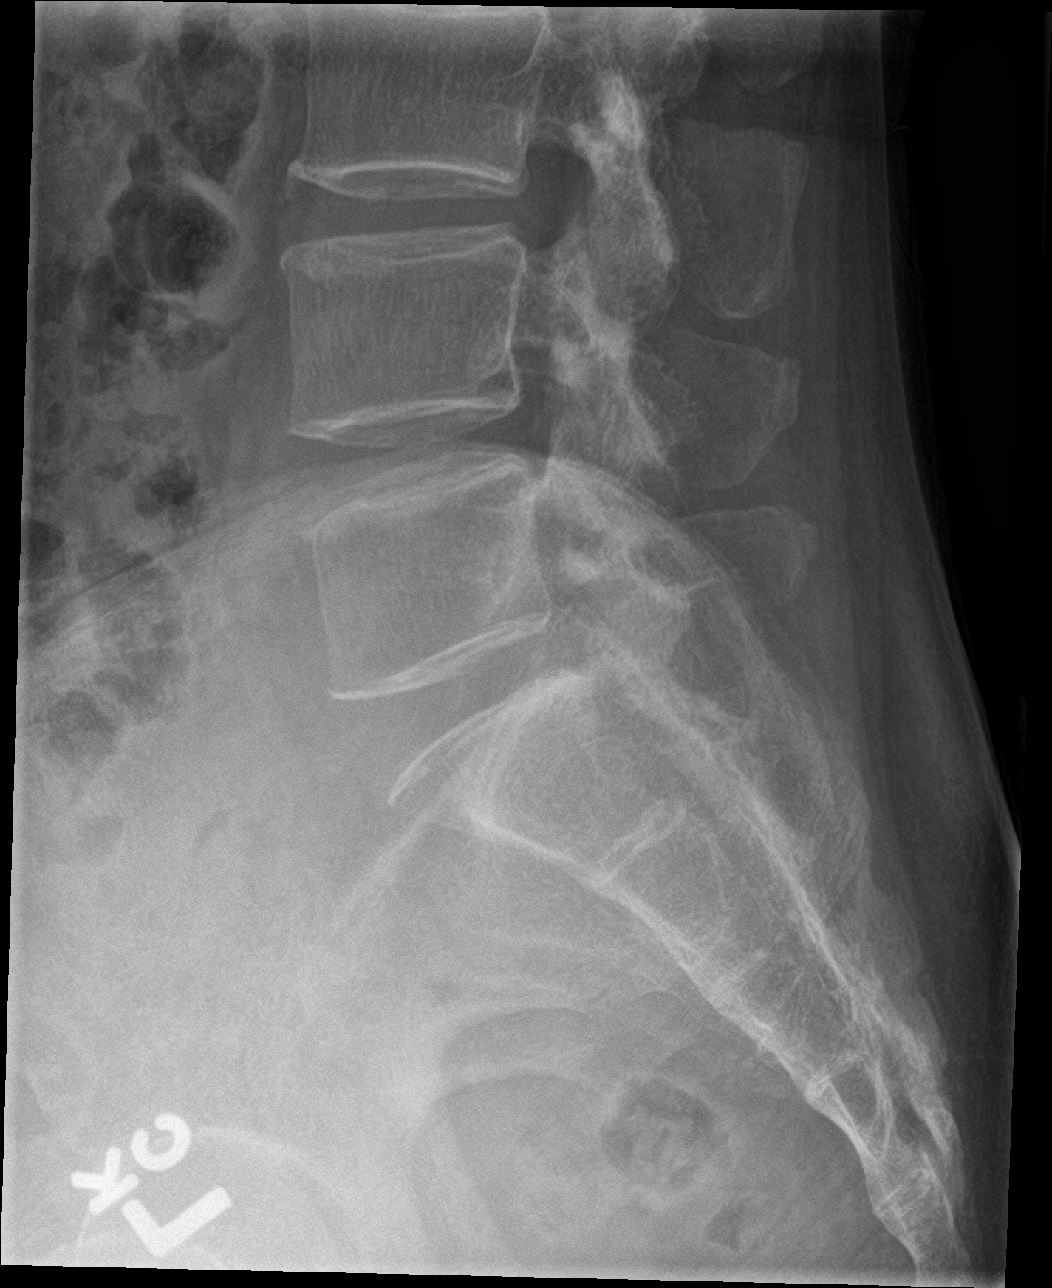

[5 of 5 positions shown; findings below may reference images not displayed]

FINDINGS: There is no evidence of lumbar spine fracture. Alignment is normal.
Intervertebral disc spaces are maintained.
IMPRESSION: Negative.

## 2018-05-09 ENCOUNTER — Ambulatory Visit: Payer: Self-pay | Admitting: Internal Medicine

## 2018-07-10 ENCOUNTER — Ambulatory Visit: Payer: Self-pay | Admitting: Internal Medicine

## 2018-07-11 ENCOUNTER — Telehealth: Payer: Self-pay | Admitting: *Deleted

## 2018-07-11 ENCOUNTER — Encounter: Payer: Self-pay | Admitting: *Deleted

## 2018-07-11 ENCOUNTER — Ambulatory Visit: Payer: Medicaid Other | Admitting: Internal Medicine

## 2018-07-11 ENCOUNTER — Encounter: Payer: Self-pay | Admitting: Internal Medicine

## 2018-07-11 ENCOUNTER — Other Ambulatory Visit: Payer: Self-pay

## 2018-07-11 VITALS — BP 128/70 | HR 64 | Temp 98.6°F | Resp 12 | Ht 63.75 in | Wt 129.0 lb

## 2018-07-11 DIAGNOSIS — K219 Gastro-esophageal reflux disease without esophagitis: Secondary | ICD-10-CM

## 2018-07-11 DIAGNOSIS — R7303 Prediabetes: Secondary | ICD-10-CM

## 2018-07-11 DIAGNOSIS — H9201 Otalgia, right ear: Secondary | ICD-10-CM

## 2018-07-11 DIAGNOSIS — G8929 Other chronic pain: Secondary | ICD-10-CM

## 2018-07-11 DIAGNOSIS — M5442 Lumbago with sciatica, left side: Secondary | ICD-10-CM

## 2018-07-11 MED ORDER — CEPHALEXIN 250 MG PO CAPS: 250.0000 mg | ORAL_CAPSULE | Freq: Four times a day (QID) | ORAL | 0 refills | Status: AC

## 2018-07-11 MED ORDER — FAMOTIDINE 40 MG PO TABS: ORAL_TABLET | ORAL | 4 refills | Status: DC

## 2018-07-11 NOTE — Progress Notes (Signed)
Subjective:    Patient ID: Caitlin Thomas, female   DOB: 1960-09-02, 58 y.o.   MRN: [id number]   HPI   Here to establish. Accompanied by [employer] nurse with [facility],  [place]  1.  Right ear pain for 2 weeks.  States the pain is about her external ear/pinnae and entire side of head.  Feels like ear is swollen inside--points to bowl of pinna.  She feels she is hearing a wind sound.  The noise is constant.   She has not had a fever, but her right head feels hot and inflamed.  Notes increased pain when opens jaw.   No change to her hearing--that seems normal She is having neck pain like an electric shock to her trap.  She has never had this before.   Has been taking 400 mg Ibuprofen, but only twice.  Had Shingles vaccine in 2019.    2.  Burning in mid epigastric extending into chest area since April.  Has not had this before as well.   Took Zantac initially for 1 month--no help Took 14 days of Nexium 20 mg twice daily.  No improvement. Switched to Pepcid 10 mg twice daily, and switched to once daily sometime ago and that has helped, but not resolved the discomfort. No melena or hematochezia.   Prior to these symptoms, she had not really changed her diet. She does eat onions. She does not eat tomatoes much She did not use NSAIDS much prior. Was drinking coffee before symptoms--once cup daily. Did drink Coke prior--only 2-3 times monthly however.   No alcohol.  No tobacco. Does not feel she is under increased stress.  2004:  Had anemia/abdominal pain and weight loss and EGD and colonoscopy were reportedly normal.  Apparently returned to [place] and given some med that took care of the problem Colonoscopy in [place] in 2015 and normal per patient--was for a screen then.   Current Meds  Medication Sig  . famotidine (PEPCID) 20 MG tablet Take 20 mg by mouth 2 (two) times daily.   No Known Allergies   Past Medical History:  Diagnosis Date  . Chronic  bilateral low back pain with left-sided sciatica Fall/winter 2018   Dr. Gladstone Lighter to get MRI 01/2017-results not noted in office note 01/08/17.  Next test ordered is CT myelogram  . History of left breast cancer 2009   unclear hx of other treatments (dx'd and treated in [place])---"5 yrs of medication, then stopped".  No radiation.  . Prediabetes 10/2016   A1c 6.0%--dietary advice given    Past Surgical History:  Procedure Laterality Date  . COLONOSCOPY  04/16/2013   Normal per pt: no records b/c it was done in [place].  . ESOPHAGOGASTRODUODENOSCOPY  09/04/2002   Anemia, wt loss, abd pain---NORMAL (Dr. Lyla Son)  . Low Back Surgery  03/2017   L3-L4  surgery in [place] for spinal stenosis  . MASTECTOMY Left 2009   done in [place] for left breast cancer   Family History  Problem Relation Age of Onset  . Stroke Neg Hx   . Diabetes Neg Hx   . Cancer Neg Hx    Social History   Socioeconomic History  . Marital status: Married    Spouse name: Annaka Cleaver  . Number of children: 1  . Years of education: Not on file  . Highest education level: 12th grade  Occupational History  . Occupation: Agricultural engineer  Social Needs  . Emergency planning/management officer  strain: Not hard at all  . Food insecurity    Worry: Never true    Inability: Never true  . Transportation needs    Medical: No    Non-medical: No  Tobacco Use  . Smoking status: Never Smoker  . Smokeless tobacco: Never Used  Substance and Sexual Activity  . Alcohol use: No  . Drug use: No  . Sexual activity: Not on file  Lifestyle  . Physical activity    Days per week: 5 days    Minutes per session: Not on file  . Stress: Not at all  Relationships  . Social Musicianconnections    Talks on phone: Not on file    Gets together: Not on file    Attends religious service: More than 4 times per year    Active member of club or organization: Not on file    Attends meetings of clubs or organizations: Not on file    Relationship status: Not on file   . Intimate partner violence    Fear of current or ex partner: No    Emotionally abused: No    Physically abused: No    Forced sexual activity: No  Other Topics Concern  . Not on file  Social History Narrative   Lives at home with husband and daughter, who is taking virtual classes with [employer] in [place].      Review of Systems    Objective:   BP 128/70 (BP Location: Left Arm, Patient Position: Sitting, Cuff Size: Normal)   Pulse 64   Temp 98.6 F (37 C)   Resp 12   Ht 5' 3.75" (1.619 m)   Wt 129 lb (58.5 kg)   BMI 22.32 kg/m   Physical Exam NAD HEENT:  PERRL, EOMI.  No conjunctival injection.  No frontal or maxillary sinus tenderness. TMs pearly gray.  External ear canals dry and flaky, but without erythema or swelling.  Right ear with bowl of pinnae with scattered 2-3 mm circular areas of erythema, but no pustules or vesicles.  Flaking in the bowl of pinna as well.  Mild tenderness on palpation.  Also tender with manipulation of pinna/tragus. NT over mastoid on right and no scalp tenderness,swelling or erythema.  No scalp lesions.   Neck:  Supple, No adenopathy Chest:  CTA CV:  RRR with normal S1 and S2.  No S3, S4 or murmur.  Carotid, radial and DP pulses normal and equal Abd:  S, mild high mid epigastric tenderness without peritoneal signs or rebound, No HSM or mass, + BS LE:  No edema  Assessment & Plan   1.  Right ear/pinnae pain:  Not clear what is causing this.  Has mild areas of inflammation in bowl of pinnae.   Cephalexin 250 mg 4 times daily for 7 days with progress report call in 1 week. Did discuss the possibility of zoster, though after further discussion, sounds like she had the vaccine beginning of this year. CBC, CMP  2.  GERD:  Famotidine 40 mg daily.  Discussed reflux precautions.  3.  Prediabetes:  FLP/A1C.

## 2018-07-11 NOTE — Patient Instructions (Signed)
Call progress in 1 week about ear and head pain

## 2018-07-11 NOTE — Congregational Nurse Program (Signed)
Pt needed translator when visit Dr's office today @ [facility].  antibiotic 4x/day 7days and pepcid 40mg  @ hs ordered and [facility]. F/u scheduled 09/13/2018 11;30 am. Spent 4 hrs this visit.

## 2018-07-12 LAB — CBC WITH DIFFERENTIAL/PLATELET
Basophils Absolute: 0.1 10*3/uL (ref 0.0–0.2)
Basos: 1 %
EOS (ABSOLUTE): 0.1 10*3/uL (ref 0.0–0.4)
Eos: 2 %
Hematocrit: 44.5 % (ref 34.0–46.6)
Hemoglobin: 14 g/dL (ref 11.1–15.9)
Immature Grans (Abs): 0 10*3/uL (ref 0.0–0.1)
Immature Granulocytes: 0 %
Lymphocytes Absolute: 2.4 10*3/uL (ref 0.7–3.1)
Lymphs: 39 %
MCH: 29.7 pg (ref 26.6–33.0)
MCHC: 31.5 g/dL (ref 31.5–35.7)
MCV: 95 fL (ref 79–97)
Monocytes Absolute: 0.4 10*3/uL (ref 0.1–0.9)
Monocytes: 7 %
Neutrophils Absolute: 3.3 10*3/uL (ref 1.4–7.0)
Neutrophils: 51 %
Platelets: 256 10*3/uL (ref 150–450)
RBC: 4.71 x10E6/uL (ref 3.77–5.28)
RDW: 12.3 % (ref 11.7–15.4)
WBC: 6.3 10*3/uL (ref 3.4–10.8)

## 2018-07-12 LAB — COMPREHENSIVE METABOLIC PANEL
ALT: 15 IU/L (ref 0–32)
AST: 20 IU/L (ref 0–40)
Albumin/Globulin Ratio: 1.9 (ref 1.2–2.2)
Albumin: 5 g/dL — ABNORMAL HIGH (ref 3.8–4.9)
Alkaline Phosphatase: 69 IU/L (ref 39–117)
BUN/Creatinine Ratio: 16 (ref 9–23)
BUN: 12 mg/dL (ref 6–24)
Bilirubin Total: 0.5 mg/dL (ref 0.0–1.2)
CO2: 24 mmol/L (ref 20–29)
Calcium: 10.1 mg/dL (ref 8.7–10.2)
Chloride: 101 mmol/L (ref 96–106)
Creatinine, Ser: 0.77 mg/dL (ref 0.57–1.00)
GFR calc Af Amer: 98 mL/min/{1.73_m2} (ref >59)
GFR calc non Af Amer: 85 mL/min/{1.73_m2} (ref >59)
Globulin, Total: 2.6 g/dL (ref 1.5–4.5)
Glucose: 95 mg/dL (ref 65–99)
Potassium: 4.8 mmol/L (ref 3.5–5.2)
Sodium: 143 mmol/L (ref 134–144)
Total Protein: 7.6 g/dL (ref 6.0–8.5)

## 2018-07-12 LAB — LIPID PANEL W/O CHOL/HDL RATIO
Cholesterol, Total: 187 mg/dL (ref 100–199)
HDL: 57 mg/dL (ref >39)
LDL Calculated: 105 mg/dL — ABNORMAL HIGH (ref 0–99)
Triglycerides: 127 mg/dL (ref 0–149)
VLDL Cholesterol Cal: 25 mg/dL (ref 5–40)

## 2018-07-12 LAB — HGB A1C W/O EAG: Hgb A1c MFr Bld: 6 % — ABNORMAL HIGH (ref 4.8–5.6)

## 2018-09-09 DIAGNOSIS — R7303 Prediabetes: Secondary | ICD-10-CM | POA: Insufficient documentation

## 2018-09-09 DIAGNOSIS — H9201 Otalgia, right ear: Secondary | ICD-10-CM | POA: Insufficient documentation

## 2018-09-09 DIAGNOSIS — K219 Gastro-esophageal reflux disease without esophagitis: Secondary | ICD-10-CM | POA: Insufficient documentation

## 2018-09-11 ENCOUNTER — Ambulatory Visit: Payer: Medicaid Other | Admitting: Internal Medicine

## 2018-09-11 ENCOUNTER — Telehealth: Payer: Self-pay | Admitting: *Deleted

## 2018-09-11 ENCOUNTER — Encounter: Payer: Self-pay | Admitting: *Deleted

## 2018-09-11 NOTE — Telephone Encounter (Signed)
Called to pt to remind have appointment 09/13/2018 @ 11;30 am at [facility]. Will meet her at the [facility].

## 2018-09-13 ENCOUNTER — Ambulatory Visit: Payer: Medicaid Other | Admitting: Internal Medicine

## 2018-09-13 ENCOUNTER — Other Ambulatory Visit: Payer: Self-pay

## 2018-09-13 ENCOUNTER — Encounter: Payer: Self-pay | Admitting: Internal Medicine

## 2018-09-13 ENCOUNTER — Encounter: Payer: Self-pay | Admitting: *Deleted

## 2018-09-13 VITALS — BP 130/82 | HR 66 | Resp 12 | Ht 63.75 in | Wt 130.0 lb

## 2018-09-13 DIAGNOSIS — H9311 Tinnitus, right ear: Secondary | ICD-10-CM

## 2018-09-13 DIAGNOSIS — R7303 Prediabetes: Secondary | ICD-10-CM

## 2018-09-13 DIAGNOSIS — K219 Gastro-esophageal reflux disease without esophagitis: Secondary | ICD-10-CM

## 2018-09-13 DIAGNOSIS — Z23 Encounter for immunization: Secondary | ICD-10-CM

## 2018-09-13 DIAGNOSIS — H9201 Otalgia, right ear: Secondary | ICD-10-CM

## 2018-09-13 MED ORDER — OMEPRAZOLE 40 MG PO CPDR: 40.0000 mg | DELAYED_RELEASE_CAPSULE | Freq: Every day | ORAL | 3 refills | Status: DC

## 2018-09-13 NOTE — Progress Notes (Signed)
    Subjective:    Patient ID: Caitlin Thomas, female   DOB: 1960-08-21, 58 y.o.   MRN: [id number]   HPI   1.  GERD:  States much better since starting Famotidine 40 mg daily, but still with discomfort in chest(apparently this went away and has now returned-has had this again for the past week.  Not as bad as before.) and RUQ.  Difficult to ascertain whether she is truly better.  Did not have the RUQ pain previously.   She is staying away from pickles as she noted recently pickles make the discomfort worse.  No melena or hematochezia No diarrhea or constipation  2.  Right ear pain:  Took course of Cephalexin. She feels this helped rid her of the pain of her pinna. The pain and tenderness. She is still having wind sound in ear and buzzing sounds. The noises are constant.  Only when she is in a quiet room, does she note the noise. She cannot say if she is having trouble hearing out of the right ear.  No sense of vertigo.  No family history of hearing loss.  3.  Prediabetes:  Discussed A1C stable tat 6.0%   Current Meds  Medication Sig  . famotidine (PEPCID) 40 MG tablet 1 tab by mouth at bedtime  . Multiple Vitamin (MULTIVITAMIN) tablet Take 1 tablet by mouth daily.   No Known Allergies   Review of Systems    Objective:   BP 130/82 (BP Location: Left Arm, Patient Position: Sitting, Cuff Size: Normal)   Pulse 66   Resp 12   Ht 5' 3.75" (1.619 m)   Wt 130 lb (59 kg)   BMI 22.49 kg/m   Physical Exam  HEENT:  PERRL EOMI, TMs pearly gray.  No erythema of pinnae today Neck:  Supple, No adenopathy Chest:  CTA CV:  RRR without murmur or rub.  Radial and DP pulses normal and equal Abd: S, No RUQ tenderness,  Mild midepigastric tenderness without rebound or peritoneal signs.  No HSM or mass, + BS   Assessment & Plan   1.  GERD:  Switch to Omeprazole 40 mg daily on empty stomach.  Follow up in 2 months.  2.  Tinnitus:  Ear pain resolved.  Her hearing with screening in office  today was normal at 20 db, but will have her see [facility] to be certain no hearing loss. Discussed soft music in quiet rooms to mask the noise  3.  Prediabetes:  Continue to monitor.  4.  HM:  Tdap.  Discussed upcoming flu vaccine clinics

## 2018-09-13 NOTE — Congregational Nurse Program (Signed)
needed translator  At the clinic. Stomach pain is came back a week ago per pt. Medicine changed  DC Pepcid 40mg  q hs ( pt. Took before meal, 65min.), omeprazol 40mg  qhs or before brealfast 1hr , per pt"s preferance.return 20month, (11-11-11a) Audiology service center will call for appointment. Spent 3hrs with this visit

## 2018-11-13 ENCOUNTER — Encounter: Payer: Self-pay | Admitting: Internal Medicine

## 2018-11-13 ENCOUNTER — Ambulatory Visit: Payer: Medicaid Other | Admitting: Internal Medicine

## 2018-11-13 ENCOUNTER — Other Ambulatory Visit: Payer: Self-pay

## 2018-11-13 VITALS — BP 132/88 | HR 74 | Resp 12 | Ht 63.75 in | Wt 130.0 lb

## 2018-11-13 DIAGNOSIS — K219 Gastro-esophageal reflux disease without esophagitis: Secondary | ICD-10-CM

## 2018-11-13 DIAGNOSIS — K449 Diaphragmatic hernia without obstruction or gangrene: Secondary | ICD-10-CM

## 2018-11-13 DIAGNOSIS — R1011 Right upper quadrant pain: Secondary | ICD-10-CM

## 2018-11-13 DIAGNOSIS — H9311 Tinnitus, right ear: Secondary | ICD-10-CM

## 2018-11-13 MED ORDER — OMEPRAZOLE 40 MG PO CPDR: 40.0000 mg | DELAYED_RELEASE_CAPSULE | Freq: Every day | ORAL | 11 refills | Status: DC

## 2018-11-13 NOTE — Progress Notes (Signed)
    Subjective:    Patient ID: Caitlin Thomas, female   DOB: [birth year], 58 y.o.   MRN: [id number]   HPI   1.  Tinnitus:  Playing music has helped mask the noise.  We have not been able to get her set up with Audiology yet.    2.  GERD:  Switched to Omeprazole at last visit.  She is having no reflux symptoms currently, though does still have some RUQ discomfort.  Has had 1-2 times weekly since last seen in September and lasts up to 24 hours.   She cannot say anything sets the discomfort off. Feels like an electric shock under her right rib cage--points to about the 9-10 rib range. Does not radiate to shoulder blade or anywhere else No associated nausea, vomiting, change in stool color, or diarrhea.   No motion of her thorax seems to affect the pain.   So much better with epigastric discomfort and reflux symptoms, but the RUQ discomfort has not changed.    Looking through her chart, underwent UGI in 2006 and found to have small hiatal hernia and reflux.   Current Meds  Medication Sig  . omeprazole (PRILOSEC) 40 MG capsule Take 1 capsule (40 mg total) by mouth daily.   No Known Allergies   Review of Systems    Objective:   BP 132/88 (BP Location: Left Arm, Patient Position: Sitting, Cuff Size: Normal)   Pulse 74   Resp 12   Ht 5' 3.75" (1.619 m)   Wt 130 lb (59 kg)   BMI 22.49 kg/m   Physical Exam  NAD Chest:  CTA No discomfort with compression of ribcage from AP and bilateral directions.  After palpating her right lower costochondral margin, she feels a sense the pain will come on as a sort of muscle cramp. CV: RRR without murmur or rub.  Radial and DP pulses normal and equal. Abd:  S, NT, No HSM or mass, + BS        Assessment & Plan   1.  GERD with small hiatal hernia (the latter found in old records)  Controlled with Omeprazole 40 mg daily.  Discussed only using on an empty stomach.  Also, to drink 3 cups of unsweetened almond milk and continue her daily walks  for bone health.  2.  RUQ discomfort:  Previously liver enzymes fine.  This sounds musculoskeletal, although may also be radicular pain with the sense of a "shock".  She will pay attention to whether worse with movement or eating.  3.  Tinnitus:  Referral to [facility] will be sent out today.

## 2018-11-13 NOTE — Patient Instructions (Signed)
Gastroesophageal Reflux Disease, Adult Gastroesophageal reflux (GER) happens when acid from the stomach flows up into the tube that connects the mouth and the stomach (esophagus). Normally, food travels down the esophagus and stays in the stomach to be digested. However, when a person has GER, food and stomach acid sometimes move back up into the esophagus. If this becomes a more serious problem, the person may be diagnosed with a disease called gastroesophageal reflux disease (GERD). GERD occurs when the reflux:  Happens often.  Causes frequent or severe symptoms.  Causes problems such as damage to the esophagus. When stomach acid comes in contact with the esophagus, the acid may cause soreness (inflammation) in the esophagus. Over time, GERD may create small holes (ulcers) in the lining of the esophagus. What are the causes? This condition is caused by a problem with the muscle between the esophagus and the stomach (lower esophageal sphincter, or LES). Normally, the LES muscle closes after food passes through the esophagus to the stomach. When the LES is weakened or abnormal, it does not close properly, and that allows food and stomach acid to go back up into the esophagus. The LES can be weakened by certain dietary substances, medicines, and medical conditions, including:  Tobacco use.  Pregnancy.  Having a hiatal hernia.  Alcohol use.  Certain foods and beverages, such as coffee, chocolate, onions, and peppermint. What increases the risk? You are more likely to develop this condition if you:  Have an increased body weight.  Have a connective tissue disorder.  Use NSAID medicines. What are the signs or symptoms? Symptoms of this condition include:  Heartburn.  Difficult or painful swallowing.  The feeling of having a lump in the throat.  Abitter taste in the mouth.  Bad breath.  Having a large amount of saliva.  Having an upset or bloated stomach.  Belching.   Chest pain. Different conditions can cause chest pain. Make sure you see your health care provider if you experience chest pain.  Shortness of breath or wheezing.  Ongoing (chronic) cough or a night-time cough.  Wearing away of tooth enamel.  Weight loss. How is this diagnosed? Your health care provider will take a medical history and perform a physical exam. To determine if you have mild or severe GERD, your health care provider may also monitor how you respond to treatment. You may also have tests, including:  A test to examine your stomach and esophagus with a small camera (endoscopy).  A test thatmeasures the acidity level in your esophagus.  A test thatmeasures how much pressure is on your esophagus.  A barium swallow or modified barium swallow test to show the shape, size, and functioning of your esophagus. How is this treated? The goal of treatment is to help relieve your symptoms and to prevent complications. Treatment for this condition may vary depending on how severe your symptoms are. Your health care provider may recommend:  Changes to your diet.  Medicine.  Surgery. Follow these instructions at home: Eating and drinking   Follow a diet as recommended by your health care provider. This may involve avoiding foods and drinks such as: ? Coffee and tea (with or without caffeine). ? Drinks that containalcohol. ? Energy drinks and sports drinks. ? Carbonated drinks or sodas. ? Chocolate and cocoa. ? Peppermint and mint flavorings. ? Garlic and onions. ? Horseradish. ? Spicy and acidic foods, including peppers, chili powder, curry powder, vinegar, hot sauces, and barbecue sauce. ? Citrus fruit juices and citrus   fruits, such as oranges, lemons, and limes. ? Tomato-based foods, such as red sauce, chili, salsa, and pizza with red sauce. ? Fried and fatty foods, such as donuts, french fries, potato chips, and high-fat dressings. ? High-fat meats, such as hot dogs and  fatty cuts of red and white meats, such as rib eye steak, sausage, ham, and bacon. ? High-fat dairy items, such as whole milk, butter, and cream cheese.  Eat small, frequent meals instead of large meals.  Avoid drinking large amounts of liquid with your meals.  Avoid eating meals during the 2-3 hours before bedtime.  Avoid lying down right after you eat.  Do not exercise right after you eat. Lifestyle   Do not use any products that contain nicotine or tobacco, such as cigarettes, e-cigarettes, and chewing tobacco. If you need help quitting, ask your health care provider.  Try to reduce your stress by using methods such as yoga or meditation. If you need help reducing stress, ask your health care provider.  If you are overweight, reduce your weight to an amount that is healthy for you. Ask your health care provider for guidance about a safe weight loss goal. General instructions  Pay attention to any changes in your symptoms.  Take over-the-counter and prescription medicines only as told by your health care provider. Do not take aspirin, ibuprofen, or other NSAIDs unless your health care provider told you to do so.  Wear loose-fitting clothing. Do not wear anything tight around your waist that causes pressure on your abdomen.  Raise (elevate) the head of your bed about 6 inches (15 cm).  Avoid bending over if this makes your symptoms worse.  Keep all follow-up visits as told by your health care provider. This is important. Contact a health care provider if:  You have: ? New symptoms. ? Unexplained weight loss. ? Difficulty swallowing or it hurts to swallow. ? Wheezing or a persistent cough. ? A hoarse voice.  Your symptoms do not improve with treatment. Get help right away if you:  Have pain in your arms, neck, jaw, teeth, or back.  Feel sweaty, dizzy, or light-headed.  Have chest pain or shortness of breath.  Vomit and your vomit looks like blood or coffee grounds.   Faint.  Have stool that is bloody or black.  Cannot swallow, drink, or eat. Summary  Gastroesophageal reflux happens when acid from the stomach flows up into the esophagus. GERD is a disease in which the reflux happens often, causes frequent or severe symptoms, or causes problems such as damage to the esophagus.  Treatment for this condition may vary depending on how severe your symptoms are. Your health care provider may recommend diet and lifestyle changes, medicine, or surgery.  Contact a health care provider if you have new or worsening symptoms.  Take over-the-counter and prescription medicines only as told by your health care provider. Do not take aspirin, ibuprofen, or other NSAIDs unless your health care provider told you to do so.  Keep all follow-up visits as told by your health care provider. This is important. This information is not intended to replace advice given to you by your health care provider. Make sure you discuss any questions you have with your health care provider. Document Released: 09/28/2004 Document Revised: 06/27/2017 Document Reviewed: 06/27/2017 Elsevier Patient Education  2020 Elsevier Inc.  

## 2018-11-19 ENCOUNTER — Telehealth: Payer: Self-pay | Admitting: *Deleted

## 2018-11-19 NOTE — Telephone Encounter (Signed)
Pt asked if I can go with her for her next Dr's visit on 02/12/2019 @ 10Am.   No changed med this visit.

## 2019-02-10 ENCOUNTER — Telehealth: Payer: Self-pay | Admitting: *Deleted

## 2019-02-10 NOTE — Telephone Encounter (Signed)
Received text from pt. Not needed interpreter at 02/11/18 . She canceled appointment, no explain given. Asked if she made another appointment, no answer yet.

## 2019-02-12 ENCOUNTER — Encounter: Payer: Medicaid Other | Admitting: Internal Medicine

## 2021-02-22 ENCOUNTER — Telehealth: Payer: Self-pay | Admitting: *Deleted

## 2021-02-22 NOTE — Telephone Encounter (Signed)
Pt said felt like vignal prolapse, working stand up long time. Language barrier, need interprter for make an appointment with Dr. And visit clinic. This nurse will make an appointment to OBGYN tomorrow and set up visit clinc.

## 2021-02-28 ENCOUNTER — Encounter: Payer: Self-pay | Admitting: Obstetrics & Gynecology

## 2021-02-28 ENCOUNTER — Ambulatory Visit (INDEPENDENT_AMBULATORY_CARE_PROVIDER_SITE_OTHER): Payer: Self-pay | Admitting: Obstetrics & Gynecology

## 2021-02-28 ENCOUNTER — Encounter: Payer: Self-pay | Admitting: *Deleted

## 2021-02-28 ENCOUNTER — Other Ambulatory Visit: Payer: Self-pay

## 2021-02-28 VITALS — BP 138/88 | HR 68 | Ht 63.25 in | Wt 130.0 lb

## 2021-02-28 DIAGNOSIS — N8110 Cystocele, unspecified: Secondary | ICD-10-CM

## 2021-02-28 DIAGNOSIS — M6289 Other specified disorders of muscle: Secondary | ICD-10-CM

## 2021-02-28 NOTE — Progress Notes (Signed)
° ° °  Caitlin Thomas 12-31-1960 [id number]        61 y.o.  G1P1L1   RP: Vaginal bulging x 1 week  HPI: Mild rounded bulge at the vagina when walking x 1 week.  Resorbs when lays down.  No vaginal pain.  No SUI, no urgency.  No h/o constipation.  No bulging with BMs.  No stool incontinence.   OB History  Gravida Para Term Preterm AB Living  1 1 1     1   SAB IAB Ectopic Multiple Live Births          1    # Outcome Date GA Lbr Len/2nd Weight Sex Delivery Anes PTL Lv  1 Term             Past medical history,surgical history, problem list, medications, allergies, family history and social history were all reviewed and documented in the EPIC chart.   Directed ROS with pertinent positives and negatives documented in the history of present illness/assessment and plan.  Exam:  Vitals:   02/28/21 0836  BP: 138/88  Pulse: 68  SpO2: 97%  Weight: 130 lb (59 kg)  Height: 5' 3.25" (1.607 m)   General appearance:  Normal  Abdomen: Normal  Gynecologic exam: Vulva normal. Bimanual exam:  Uterus RV, normal volume, mobile, NT.  No adnexal mass, NT.                                    Gyn exam standing with valsalva:  Minimal, max grade 1/4 Cystocele.  No Rectocele.  No Uterine prolapse.   Assessment/Plan:  61 y.o. G1P1001   1. Baden-Walker grade 1 cystocele Mild rounded bulge at the vagina when walking x 1 week.  Resorbs when lays down.  No vaginal pain.  No SUI, no urgency.  No h/o constipation.  No bulging with BMs.  No stool incontinence.  Mild grade 1/4 Cystocele in standing position with valsalva.  Precautions to avoid progression thoroughly reviewed with avoidance of pelvic floor pressure.  Recommend emptying the bladder on a regular basis before it overfills.  Kegel exercises instructed and recommended.  Patient declines PT at this time.  F/U in 3 months to reassess with Annual/Gyn visit.  2. Pelvic floor dysfunction in female  As above.  Princess Bruins MD, 8:54 AM  02/28/2021

## 2021-02-28 NOTE — Congregational Nurse Program (Signed)
Visit dr. Seymour Thomas to check up bulging vagina. Minimal vaginal wall weak that not require surgery. Prevent to  bulging out, frequent urination, avoid constipation, Kegel exercise. If pt wants referral PT for more exercise that pr. Refused due to work. F/u appointment @ 05/31/2021 8:15 am.

## 2021-05-31 ENCOUNTER — Other Ambulatory Visit (HOSPITAL_COMMUNITY)
Admission: RE | Admit: 2021-05-31 | Discharge: 2021-05-31 | Disposition: A | Discharge status: Home / Self Care | Payer: Medicaid Other | Source: Ambulatory Visit | Attending: Obstetrics & Gynecology | Admitting: Obstetrics & Gynecology

## 2021-05-31 ENCOUNTER — Encounter: Payer: Self-pay | Admitting: Obstetrics & Gynecology

## 2021-05-31 ENCOUNTER — Ambulatory Visit (INDEPENDENT_AMBULATORY_CARE_PROVIDER_SITE_OTHER): Payer: Self-pay | Admitting: Obstetrics & Gynecology

## 2021-05-31 VITALS — BP 112/78 | HR 68 | Resp 16 | Ht 63.25 in | Wt 131.0 lb

## 2021-05-31 DIAGNOSIS — Z01419 Encounter for gynecological examination (general) (routine) without abnormal findings: Secondary | ICD-10-CM

## 2021-05-31 DIAGNOSIS — N8110 Cystocele, unspecified: Secondary | ICD-10-CM

## 2021-05-31 DIAGNOSIS — Z9012 Acquired absence of left breast and nipple: Secondary | ICD-10-CM

## 2021-05-31 DIAGNOSIS — N841 Polyp of cervix uteri: Secondary | ICD-10-CM | POA: Insufficient documentation

## 2021-05-31 DIAGNOSIS — Z780 Asymptomatic menopausal state: Secondary | ICD-10-CM

## 2021-05-31 NOTE — Progress Notes (Unsigned)
Caitlin Thomas 02/11/1960 [id number]   History:    61 y.o. G1P1L1 Married.  [place] interpreter present.  RP:  Established patient presenting for annual gyn exam   HPI: Patient seen on 02/2021: Mild rounded bulge at the vagina when walking x 1 week.  Resorbs when lays down.  No vaginal pain.  No SUI, no urgency.  No h/o constipation.  No bulging with BMs.  No stool incontinence.  Mild grade 1/4 Cystocele in standing position with valsalva.  Doing Kegels, Sxs are stable.  Postmenopause, well on no HRT.  No PMB.  No pelvic pain.  No pain with IC.  PAP Neg in 12/22 [place]).  Pap reflex today. Breasts normal.  Mammo Neg in 2022 [place]).  Will schedule now at the [place].  [place] in 2022 [place]). BMI 23.02.  [facility] here today.   Past medical history,surgical history, family history and social history were all reviewed and documented in the EPIC chart.  Gynecologic History No LMP recorded. Patient is postmenopausal.  Obstetric History OB History  Gravida Para Term Preterm AB Living  $Remov'1 1 1     1  'ZNCNue$ SAB IAB Ectopic Multiple Live Births          1    # Outcome Date GA Lbr Len/2nd Weight Sex Delivery Anes PTL Lv  1 Term              ROS: A ROS was performed and pertinent positives and negatives are included in the history.  GENERAL: No fevers or chills. HEENT: No change in vision, no earache, sore throat or sinus congestion. NECK: No pain or stiffness. CARDIOVASCULAR: No chest pain or pressure. No palpitations. PULMONARY: No shortness of breath, cough or wheeze. GASTROINTESTINAL: No abdominal pain, nausea, vomiting or diarrhea, melena or bright red blood per rectum. GENITOURINARY: No urinary frequency, urgency, hesitancy or dysuria. MUSCULOSKELETAL: No joint or muscle pain, no back pain, no recent trauma. DERMATOLOGIC: No rash, no itching, no lesions. ENDOCRINE: No polyuria, polydipsia, no heat or cold intolerance. No recent change in weight. HEMATOLOGICAL: No anemia or easy  bruising or bleeding. NEUROLOGIC: No headache, seizures, numbness, tingling or weakness. PSYCHIATRIC: No depression, no loss of interest in normal activity or change in sleep pattern.     Exam:   BP 112/78   Pulse 68   Resp 16   Ht 5' 3.25" (1.607 m)   Wt 131 lb (59.4 kg)   BMI 23.02 kg/m   Body mass index is 23.02 kg/m.  General appearance : Well developed well nourished female. No acute distress HEENT: Eyes: no retinal hemorrhage or exudates,  Neck supple, trachea midline, no carotid bruits, no thyroidmegaly Lungs: Clear to auscultation, no rhonchi or wheezes, or rib retractions  Heart: Regular rate and rhythm, no murmurs or gallops Breast:Examined in sitting and supine position were symmetrical in appearance, no palpable masses or tenderness,  no skin retraction, no nipple inversion, no nipple discharge, no skin discoloration, no axillary or supraclavicular lymphadenopathy Abdomen: no palpable masses or tenderness, no rebound or guarding Extremities: no edema or skin discoloration or tenderness  Pelvic: Vulva: Normal             Vagina: No gross lesions or discharge  Cervix: No discharge.  Endocervical polyp present.  Pap test done.  Polyp removed with clamp rotation.  Good hemostasis.  Sent to [facility].  Uterus  AV, normal size, shape and consistency, non-tender and mobile  Adnexa  Without masses or tenderness  Anus: Normal   Assessment/Plan:  61 y.o. female for annual exam   1. Encounter for routine gynecological examination with Papanicolaou smear of cervix Patient seen on 02/2021: Mild rounded bulge at the vagina when walking x 1 week.  Resorbs when lays down.  No vaginal pain.  No SUI, no urgency.  No h/o constipation.  No bulging with BMs.  No stool incontinence.  Mild grade 1/4 Cystocele in standing position with valsalva.  Doing Kegels, Sxs are stable.  Postmenopause, well on no HRT.  No PMB.  No pelvic pain.  No pain with IC.  PAP Neg in 12/22 [place]).  Pap reflex today.  Breasts normal.  Mammo Neg in 2022 [place]).  Will schedule now at the [facility].  [place] in 2022 [place]). BMI 23.02.  F[facility] here today. - Cytology - PAP( [place]) - CBC - Comp Met ([facility]) - TSH - Lipid Profile - Vitamin D (25 hydroxy)  2. Postmenopause  Postmenopause, well on no HRT.  No PMB.  No pelvic pain.  No pain with IC.  3. Endocervical polyp Easy removal of Endocervical Polyp.  Sent to pathology.  No Cx. - Surgical pathology( [place]/ [facility])  4. Baden-Walker grade 1 cystocele Stable grade 1/4 Cystocele.  Will continue Kegels.  Counseling on management with pessary and surgery done.  Declines PT referral at this time.  5. 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  Princess Bruins MD, 8:58 AM 05/31/2021

## 2021-06-01 ENCOUNTER — Encounter: Payer: Self-pay | Admitting: Obstetrics & Gynecology

## 2021-06-01 ENCOUNTER — Encounter: Payer: Self-pay | Admitting: *Deleted

## 2021-06-01 LAB — LIPID PANEL
Cholesterol: 193 mg/dL (ref <200)
HDL: 70 mg/dL (ref >=50)
LDL Cholesterol (Calc): 106 mg/dL (calc) — ABNORMAL HIGH
Non-HDL Cholesterol (Calc): 123 mg/dL (calc) (ref <130)
Total CHOL/HDL Ratio: 2.8 (calc) (ref <5.0)
Triglycerides: 82 mg/dL (ref <150)

## 2021-06-01 LAB — COMPREHENSIVE METABOLIC PANEL
AG Ratio: 1.6 (calc) (ref 1.0–2.5)
ALT: 12 U/L (ref 6–29)
AST: 17 U/L (ref 10–35)
Albumin: 4.9 g/dL (ref 3.6–5.1)
Alkaline phosphatase (APISO): 69 U/L (ref 37–153)
BUN: 21 mg/dL (ref 7–25)
CO2: 23 mmol/L (ref 20–32)
Calcium: 9.8 mg/dL (ref 8.6–10.4)
Chloride: 104 mmol/L (ref 98–110)
Creat: 0.75 mg/dL (ref 0.50–1.05)
Globulin: 3.1 g/dL (calc) (ref 1.9–3.7)
Glucose, Bld: 98 mg/dL (ref 65–99)
Potassium: 4.2 mmol/L (ref 3.5–5.3)
Sodium: 140 mmol/L (ref 135–146)
Total Bilirubin: 0.5 mg/dL (ref 0.2–1.2)
Total Protein: 8 g/dL (ref 6.1–8.1)

## 2021-06-01 LAB — VITAMIN D 25 HYDROXY (VIT D DEFICIENCY, FRACTURES): Vit D, 25-Hydroxy: 46 ng/mL (ref 30–100)

## 2021-06-01 LAB — SURGICAL PATHOLOGY

## 2021-06-01 LAB — CBC
HCT: 44.7 % (ref 35.0–45.0)
Hemoglobin: 14.5 g/dL (ref 11.7–15.5)
MCH: 30 pg (ref 27.0–33.0)
MCHC: 32.4 g/dL (ref 32.0–36.0)
MCV: 92.5 fL (ref 80.0–100.0)
MPV: 10.7 fL (ref 7.5–12.5)
Platelets: 290 10*3/uL (ref 140–400)
RBC: 4.83 10*6/uL (ref 3.80–5.10)
RDW: 12.2 % (ref 11.0–15.0)
WBC: 7 10*3/uL (ref 3.8–10.8)

## 2021-06-01 LAB — TSH: TSH: 3.02 mIU/L (ref 0.40–4.50)

## 2021-06-01 LAB — CYTOLOGY - PAP: Diagnosis: NEGATIVE

## 2021-06-01 NOTE — Congregational Nurse Program (Signed)
  Dept: [phone]   Congregational Nurse Program Note  Date of Encounter: 05/31/2021  Past Medical History: Past Medical History:  Diagnosis Date   Chronic bilateral low back pain with left-sided sciatica Fall/winter 2018   Dr. Darrelyn Hillock to get MRI 01/2017-results not noted in office note 01/08/17.  Next test ordered is CT myelogram   GERD (gastroesophageal reflux disease)    History of left breast cancer 2009   unclear hx of other treatments (dx'd and treated in [place])---"5 yrs of medication, then stopped".  No radiation.   Prediabetes 10/2016   A1c 6.0%--dietary advice given    Encounter Details:  CNP Questionnaire - 05/31/21 0940       Questionnaire   Do you give verbal consent to treat you today? Yes    Location Patient Served  Not Applicable   Dr's office   Visit Setting MD Office    Patient Status Unknown    Insurance Uninsured ([employer]/Self-Pay)    Insurance Referral N/A    Medication N/A    Medical Provider No   no primary Dr. for now per pt.   Screening Referrals N/A    Medical Referral N/A    Medical Appointment Made N/A    Food N/A    Transportation N/A    Housing/Utilities N/A    Interpersonal Safety N/A    Intervention N/A;Spiritual Care;Support    ED Visit Averted N/A    Life-Saving Intervention Made N/A           F/u with Dr. Jodell Cipro. Since pt did not have primary Dr, check blood test and pep smear. Pt said updated all endo scopy mammogram, colonoscopy 2022 December in [place], which do not have any information in [place], Dr Jodell Cipro suggested check pep smear and blood works, pt. Agreed. Little polyp removed from cervical area under pt's consent. Lab result will notify to pt, also pt can access my chart. Unless labis abnormal , having problem, will see pt next year.   Passary insertion or surgery for cystocele information given by Dr. Jodell Cipro, pt will observation per year.

## 2021-06-10 ENCOUNTER — Telehealth: Payer: Self-pay | Admitting: *Deleted

## 2021-06-10 NOTE — Telephone Encounter (Signed)
Pt, sent message , regarding bleeding from area, sent messages and called, no answer several times. Will try call again.

## 2021-06-11 ENCOUNTER — Telehealth: Payer: Self-pay | Admitting: *Deleted

## 2021-06-11 NOTE — Telephone Encounter (Signed)
Pt sent text message that bleeding is slower now. Will observe per pt.

## 2022-06-06 ENCOUNTER — Ambulatory Visit (INDEPENDENT_AMBULATORY_CARE_PROVIDER_SITE_OTHER): Payer: Medicaid Other | Admitting: Radiology

## 2022-06-06 ENCOUNTER — Encounter: Payer: Self-pay | Admitting: Radiology

## 2022-06-06 VITALS — BP 130/86 | Ht 63.25 in | Wt 131.0 lb

## 2022-06-06 DIAGNOSIS — N8110 Cystocele, unspecified: Secondary | ICD-10-CM | POA: Diagnosis not present

## 2022-06-06 DIAGNOSIS — E2839 Other primary ovarian failure: Secondary | ICD-10-CM | POA: Diagnosis not present

## 2022-06-06 DIAGNOSIS — Z9012 Acquired absence of left breast and nipple: Secondary | ICD-10-CM

## 2022-06-06 DIAGNOSIS — Z01419 Encounter for gynecological examination (general) (routine) without abnormal findings: Secondary | ICD-10-CM | POA: Diagnosis not present

## 2022-06-06 NOTE — Progress Notes (Signed)
   Caitlin Thomas 03-28-1960 [id number]   History:  62 y.o. G1P1 presents for annual exam. No gyn concerns. Cervical polyp removed at last annual benign. Has not had f/u breat imaging since moving from [place]. Hx of left breast cancer.   Gynecologic History  Sexually active: no  Health Maintenance Last Pap: 2023: normal  Last mammogram: 2019 Last colonoscopy: 2022. R Last Dexa: 2015. Results were: ?osteopenia HRT use:no  Past medical history, past surgical history, family history and social history were all reviewed and documented in the EPIC chart.  ROS:  A ROS was performed and pertinent positives and negatives are included.  Exam:  Vitals:   06/06/22 0840  BP: 130/86  Weight: 131 lb (59.4 kg)  Height: 5' 3.25" (1.607 m)   Body mass index is 23.02 kg/m.  General appearance:  Normal Thyroid:  Symmetrical, normal in size, without palpable masses or nodularity. Respiratory  Auscultation:  Clear without wheezing or rhonchi Cardiovascular  Auscultation:  Regular rate, without rubs, murmurs or gallops  Edema/varicosities:  Not grossly evident Abdominal  Soft,nontender, without masses, guarding or rebound.  Liver/spleen:  No organomegaly noted  Hernia:  None appreciated  Skin  Inspection:  Grossly normal Breasts: Examined lying and sitting.   Right: Without masses, retractions, nipple discharge or axillary adenopathy.   Left: Without masses, retractions, nipple discharge or axillary adenopathy. Genitourinary   Inguinal/mons:  Normal without inguinal adenopathy  External genitalia:  Normal appearing vulva with no masses, tenderness, or lesions  BUS/Urethra/Skene's glands:  Normal  Vagina:  Normal appearing with normal color and discharge, no lesions. Atrophy mild. Grade 1 cystocele with valsalva.  Cervix:  no lesions, discharge or polyps.  Uterus:  small mobile, non tender  Adnexa/parametria:     Rt: Normal in size, without masses or tenderness.   Lt: Normal in size,  without masses or tenderness.  Anus and perineum: Normal   Raynelle Fanning, CMA present for exam  Assessment/Plan:   1. Well woman exam with routine gynecological exam Pap up to date Cervical polyp resolved  2. Baden-Walker grade 1 cystocele No change, remains stable  3. H/O left mastectomy Overdue for mammogram, will schedule at the breast center  4. Estrogen deficiency Last DEXA 2015 in [place], osteopenia from what I understand via the interpreter - DG Bone Density; Future     Discussed SBE, colonoscopy and DEXA screening as appropriate. Encouraged 191mins/week of cardiovascular and weight bearing exercise minimum. Recommend the use of seatbelts and sunscreen consistently.   Return in 1 year for annual or sooner prn.  Arlie Solomons B WHNP-BC 9:04 AM 06/06/2022

## 2023-06-26 ENCOUNTER — Ambulatory Visit (INDEPENDENT_AMBULATORY_CARE_PROVIDER_SITE_OTHER): Payer: Self-pay | Admitting: Radiology

## 2023-06-26 ENCOUNTER — Encounter: Payer: Self-pay | Admitting: Radiology

## 2023-06-26 VITALS — BP 138/88 | HR 65 | Ht 64.5 in | Wt 131.0 lb

## 2023-06-26 DIAGNOSIS — Z01419 Encounter for gynecological examination (general) (routine) without abnormal findings: Secondary | ICD-10-CM

## 2023-06-26 DIAGNOSIS — N8110 Cystocele, unspecified: Secondary | ICD-10-CM

## 2023-06-26 DIAGNOSIS — Z1331 Encounter for screening for depression: Secondary | ICD-10-CM

## 2023-06-26 NOTE — Progress Notes (Signed)
   Caitlin Thomas 01/15/1960 [id number]   History:  63 y.o. G1P1 presents for annual exam. No gyn concerns. Hx of Cervical polyp. Has not had f/u breat imaging since moving from [place]. Hx of left breast cancer with lumpectomy.   Gynecologic History  Sexually active: no  Health Maintenance Last Pap: 2023: normal  Last mammogram: 2022 in [place] per pt Last colonoscopy: 2022 in [place] Last Dexa: 2015. Results were: ?osteopenia HRT use:no      06/26/2023    8:04 AM  Depression screen PHQ 2/9  Decreased Interest 0  Down, Depressed, Hopeless 0  PHQ - 2 Score 0     Past medical history, past surgical history, family history and social history were all reviewed and documented in the EPIC chart.  ROS:  A ROS was performed and pertinent positives and negatives are included.  Exam:  Vitals:   06/26/23 0808  BP: 138/88  Pulse: 65  SpO2: 96%  Weight: 131 lb (59.4 kg)  Height: 5' 4.5 (1.638 m)   Body mass index is 22.14 kg/m.  General appearance:  Normal Thyroid:  Symmetrical, normal in size, without palpable masses or nodularity. Respiratory  Auscultation:  Clear without wheezing or rhonchi Cardiovascular  Auscultation:  Regular rate, without rubs, murmurs or gallops  Edema/varicosities:  Not grossly evident Abdominal  Soft,nontender, without masses, guarding or rebound.  Liver/spleen:  No organomegaly noted  Hernia:  None appreciated  Skin  Inspection:  Grossly normal Breasts: Examined lying and sitting.   Right: Without masses, retractions, nipple discharge or axillary adenopathy.   Left: Without masses, retractions, nipple discharge or axillary adenopathy. Genitourinary   Inguinal/mons:  Normal without inguinal adenopathy  External genitalia:  Normal appearing vulva with no masses, tenderness, or lesions  BUS/Urethra/Skene's glands:  Normal  Vagina:  Normal appearing with normal color and discharge, no lesions. Atrophy mild. Grade 2 cystocele with valsalva.  Cervix:   no lesions, discharge or polyps.  Uterus:  small mobile, non tender  Adnexa/parametria:     Rt: Normal in size, without masses or tenderness.   Lt: Normal in size, without masses or tenderness.  Anus and perineum: Normal   Darice Hoit, CMA present for exam  Assessment/Plan:   1. Well woman exam with routine gynecological exam Pap up to date repeat 2026 Schedule DEXA Schedule mammogram- she is self pay currently until she gets Medicare in October, Advised she may go to the health department for no cost referral for mammogram  2. Depression screening negative  3. Baden-Walker grade 2 cystocele Declines pessary fitting or referral at this time    Discussed SBE, colonoscopy and DEXA screening as appropriate.  Return in 1 year for annual or sooner prn.  GINETTE COZIER B WHNP-BC 8:27 AM 06/26/2023

## 2023-06-26 NOTE — Patient Instructions (Signed)
 Preventive Care 16-65 Years Old, Female  Preventive care refers to lifestyle choices and visits with your health care provider that can promote health and wellness. Preventive care visits are also called wellness exams.  What can I expect for my preventive care visit?  Counseling  Your health care provider may ask you questions about your:  Medical history, including:  Past medical problems.  Family medical history.  Pregnancy history.  Current health, including:  Menstrual cycle.  Method of birth control.  Emotional well-being.  Home life and relationship well-being.  Sexual activity and sexual health.  Lifestyle, including:  Alcohol, nicotine or tobacco, and drug use.  Access to firearms.  Diet, exercise, and sleep habits.  Work and work Astronomer.  Sunscreen use.  Safety issues such as seatbelt and bike helmet use.  Physical exam  Your health care provider will check your:  Height and weight. These may be used to calculate your BMI (body mass index). BMI is a measurement that tells if you are at a healthy weight.  Waist circumference. This measures the distance around your waistline. This measurement also tells if you are at a healthy weight and may help predict your risk of certain diseases, such as type 2 diabetes and high blood pressure.  Heart rate and blood pressure.  Body temperature.  Skin for abnormal spots.  What immunizations do I need?    Vaccines are usually given at various ages, according to a schedule. Your health care provider will recommend vaccines for you based on your age, medical history, and lifestyle or other factors, such as travel or where you work.  What tests do I need?  Screening  Your health care provider may recommend screening tests for certain conditions. This may include:  Lipid and cholesterol levels.  Diabetes screening. This is done by checking your blood sugar (glucose) after you have not eaten for a while (fasting).  Pelvic exam and Pap test.  Hepatitis B test.  Hepatitis C  test.  HIV (human immunodeficiency virus) test.  STI (sexually transmitted infection) testing, if you are at risk.  Lung cancer screening.  Colorectal cancer screening.  Mammogram. Talk with your health care provider about when you should start having regular mammograms. This may depend on whether you have a family history of breast cancer.  BRCA-related cancer screening. This may be done if you have a family history of breast, ovarian, tubal, or peritoneal cancers.  Bone density scan. This is done to screen for osteoporosis.  Talk with your health care provider about your test results, treatment options, and if necessary, the need for more tests.  Follow these instructions at home:  Eating and drinking    Eat a diet that includes fresh fruits and vegetables, whole grains, lean protein, and low-fat dairy products.  Take vitamin and mineral supplements as recommended by your health care provider.  Do not drink alcohol if:  Your health care provider tells you not to drink.  You are pregnant, may be pregnant, or are planning to become pregnant.  If you drink alcohol:  Limit how much you have to 0-1 drink a day.  Know how much alcohol is in your drink. In the [place] one drink equals one 12 oz bottle of beer (355 mL), one 5 oz glass of wine (148 mL), or one 1 oz glass of hard liquor (44 mL).  Lifestyle  Brush your teeth every morning and night with fluoride toothpaste. Floss one time each day.  Exercise for at least  30 minutes 5 or more days each week.  Do not use any products that contain nicotine or tobacco. These products include cigarettes, chewing tobacco, and vaping devices, such as e-cigarettes. If you need help quitting, ask your health care provider.  Do not use drugs.  If you are sexually active, practice safe sex. Use a condom or other form of protection to prevent STIs.  If you do not wish to become pregnant, use a form of birth control. If you plan to become pregnant, see your health care provider for a  prepregnancy visit.  Take aspirin only as told by your health care provider. Make sure that you understand how much to take and what form to take. Work with your health care provider to find out whether it is safe and beneficial for you to take aspirin daily.  Find healthy ways to manage stress, such as:  Meditation, yoga, or listening to music.  Journaling.  Talking to a trusted person.  Spending time with friends and family.  Minimize exposure to UV radiation to reduce your risk of skin cancer.  Safety  Always wear your seat belt while driving or riding in a vehicle.  Do not drive:  If you have been drinking alcohol. Do not ride with someone who has been drinking.  When you are tired or distracted.  While texting.  If you have been using any mind-altering substances or drugs.  Wear a helmet and other protective equipment during sports activities.  If you have firearms in your house, make sure you follow all gun safety procedures.  Seek help if you have been physically or sexually abused.  What's next?  Visit your health care provider once a year for an annual wellness visit.  Ask your health care provider how often you should have your eyes and teeth checked.  Stay up to date on all vaccines.  This information is not intended to replace advice given to you by your health care provider. Make sure you discuss any questions you have with your health care provider.  Document Revised: 06/16/2020 Document Reviewed: 06/16/2020  Elsevier Patient Education  2024 [employer].

## 2024-01-14 ENCOUNTER — Ambulatory Visit: Admitting: Sports Medicine

## 2024-01-14 ENCOUNTER — Encounter: Payer: Self-pay | Admitting: Sports Medicine

## 2024-01-14 VITALS — BP 189/102 | HR 72 | Ht 64.0 in | Wt 130.0 lb

## 2024-01-14 DIAGNOSIS — Z13220 Encounter for screening for lipoid disorders: Secondary | ICD-10-CM

## 2024-01-14 DIAGNOSIS — Z780 Asymptomatic menopausal state: Secondary | ICD-10-CM

## 2024-01-14 DIAGNOSIS — Z113 Encounter for screening for infections with a predominantly sexual mode of transmission: Secondary | ICD-10-CM | POA: Diagnosis not present

## 2024-01-14 DIAGNOSIS — Z131 Encounter for screening for diabetes mellitus: Secondary | ICD-10-CM

## 2024-01-14 DIAGNOSIS — Z1231 Encounter for screening mammogram for malignant neoplasm of breast: Secondary | ICD-10-CM | POA: Diagnosis not present

## 2024-01-14 DIAGNOSIS — I10 Essential (primary) hypertension: Secondary | ICD-10-CM

## 2024-01-14 DIAGNOSIS — G44229 Chronic tension-type headache, not intractable: Secondary | ICD-10-CM | POA: Diagnosis not present

## 2024-01-14 DIAGNOSIS — Z1329 Encounter for screening for other suspected endocrine disorder: Secondary | ICD-10-CM

## 2024-01-14 DIAGNOSIS — Z23 Encounter for immunization: Secondary | ICD-10-CM

## 2024-01-14 LAB — CBC WITH DIFFERENTIAL/PLATELET
Basophils Absolute: 0.1 K/uL (ref 0.0–0.1)
Basophils Relative: 0.9 % (ref 0.0–3.0)
Eosinophils Absolute: 0.1 K/uL (ref 0.0–0.7)
Eosinophils Relative: 1.6 % (ref 0.0–5.0)
HCT: 41.5 % (ref 36.0–46.0)
Hemoglobin: 13.9 g/dL (ref 12.0–15.0)
Lymphocytes Relative: 31.5 % (ref 12.0–46.0)
Lymphs Abs: 1.9 K/uL (ref 0.7–4.0)
MCHC: 33.5 g/dL (ref 30.0–36.0)
MCV: 89.1 fl (ref 78.0–100.0)
Monocytes Absolute: 0.4 K/uL (ref 0.1–1.0)
Monocytes Relative: 7 % (ref 3.0–12.0)
Neutro Abs: 3.5 K/uL (ref 1.4–7.7)
Neutrophils Relative %: 59 % (ref 43.0–77.0)
Platelets: 259 K/uL (ref 150.0–400.0)
RBC: 4.66 Mil/uL (ref 3.87–5.11)
RDW: 13.2 % (ref 11.5–15.5)
WBC: 5.9 K/uL (ref 4.0–10.5)

## 2024-01-14 LAB — TSH: TSH: 2.71 u[IU]/mL (ref 0.35–5.50)

## 2024-01-14 LAB — LIPID PANEL
Cholesterol: 186 mg/dL (ref 28–200)
HDL: 62.9 mg/dL
LDL Cholesterol: 105 mg/dL — ABNORMAL HIGH (ref 10–99)
NonHDL: 123.24
Total CHOL/HDL Ratio: 3
Triglycerides: 92 mg/dL (ref 10.0–149.0)
VLDL: 18.4 mg/dL (ref 0.0–40.0)

## 2024-01-14 LAB — URINALYSIS
Bilirubin Urine: NEGATIVE
Ketones, ur: NEGATIVE
Leukocytes,Ua: NEGATIVE
Nitrite: NEGATIVE
Specific Gravity, Urine: 1.02 (ref 1.000–1.030)
Total Protein, Urine: NEGATIVE
Urine Glucose: NEGATIVE
Urobilinogen, UA: 0.2 (ref 0.0–1.0)
pH: 7 (ref 5.0–8.0)

## 2024-01-14 LAB — HEMOGLOBIN A1C: Hgb A1c MFr Bld: 6 % (ref 4.6–6.5)

## 2024-01-14 MED ORDER — LOSARTAN POTASSIUM 50 MG PO TABS: 50.0000 mg | ORAL_TABLET | Freq: Every day | ORAL | 0 refills | Status: AC

## 2024-01-14 NOTE — Progress Notes (Signed)
 "  Careteam: Patient Care Team: Sherlynn Madden, MD as PCP - General (Internal Medicine) Heide Ingle, MD as Consulting Physician (Orthopedic Surgery)  Allergies[1]  Chief Complaint  Patient presents with   Establish Care    New pt est care. She has been having headaches for over a year. She would like a flu shot.    Discussed the use of AI scribe software for clinical note transcription with the patient, who gave verbal consent to proceed.  History of Present Illness    Caitlin Thomas is a 64 year old female who presents to establish care.  Interpreter is available for the visit   BP is very high during the vist. Pt denies chest pain, palpitations, SOB, abdominal pain, nausea, vomiting, blurry or double vision. She does not check her bp regularly but states when she checks at home they were wnl.  History of headaches located at the back of the head, sometimes radiating. Pt reports that her headache is getting worse since the past 2 months. It lasts for about 30 min, denies nausea, vomiting, vision changes.  No recent changes in vision, sinus allergies, or balance issues.  History of breast cancer with a left mastectomy. Last mammogram was in 2024. No recent weight loss, fatigue, cough, or joint pain. No history of asthma, breathing problems, ulcers, heartburn, or acid reflux.  Review of Systems:  Review of Systems  Constitutional:  Negative for chills, fever, malaise/fatigue and weight loss.  HENT:  Negative for congestion.   Eyes:  Negative for blurred vision.  Respiratory:  Negative for cough.   Cardiovascular:  Negative for chest pain, palpitations and leg swelling.  Gastrointestinal:  Negative for nausea and vomiting.  Genitourinary:  Negative for dysuria, frequency and hematuria.  Musculoskeletal:  Negative for falls and joint pain.  Neurological:  Positive for headaches. Negative for dizziness.  Psychiatric/Behavioral:  Negative for depression and suicidal ideas.     Negative unless indicated in HPI.   Patient Active Problem List   Diagnosis Date Noted   Prediabetes 09/09/2018   Right ear pain 09/09/2018   Gastroesophageal reflux disease 09/09/2018   Chronic bilateral low back pain with left-sided sciatica    Spinal stenosis of lumbar region 01/08/2017   ANEMIA 08/12/2007   DIZZINESS 08/12/2007   Past Medical History:  Diagnosis Date   Chronic bilateral low back pain with left-sided sciatica Fall/winter 2018   Dr. Heide to get MRI 01/2017-results not noted in office note 01/08/17.  Next test ordered is CT myelogram   GERD (gastroesophageal reflux disease)    History of left breast cancer 2009   unclear hx of other treatments (dx'd and treated in [place])---5 yrs of medication, then stopped.  No radiation.   HTN (hypertension)    Prediabetes 10/2016   A1c 6.0%--dietary advice given   Past Surgical History:  Procedure Laterality Date   COLONOSCOPY  04/16/2013   Normal per pt: no records b/c it was done in [place].   ESOPHAGOGASTRODUODENOSCOPY  09/04/2002   Anemia, wt loss, abd pain---NORMAL (Dr. Sheppard Finn)   Low Back Surgery  03/2017   L3-L4  surgery in [place] for spinal stenosis   MASTECTOMY Left 2009   done in [place] for left breast cancer   Social History[2] Family History  Problem Relation Age of Onset   Stroke Neg Hx    Diabetes Neg Hx    Cancer Neg Hx    Allergies[3]  Medications: Patient's Medications  New Prescriptions   LOSARTAN  (  COZAAR ) 50 MG TABLET    Take 1 tablet (50 mg total) by mouth daily.  Previous Medications   ACETAMINOPHEN  (TYLENOL ) 325 MG TABLET    Take 325 mg by mouth every 6 (six) hours as needed.   ELDERBERRY PO    Take by mouth.  Modified Medications   No medications on file  Discontinued Medications   No medications on file    Physical Exam: Vitals:   01/14/24 0900 01/14/24 0905 01/14/24 0937  BP: (!) 199/103 (!) 177/102 (!) 189/102  Pulse: 72    TempSrc: Oral    SpO2: 100%     Weight: 130 lb (59 kg)    Height: 5' 4 (1.626 m)     Body mass index is 22.31 kg/m. BP Readings from Last 3 Encounters:  01/14/24 (!) 189/102  06/26/23 138/88  06/06/22 130/86   Wt Readings from Last 3 Encounters:  01/14/24 130 lb (59 kg)  06/26/23 131 lb (59.4 kg)  06/06/22 131 lb (59.4 kg)    Physical Exam Constitutional:      Appearance: Normal appearance.  HENT:     Head: Normocephalic and atraumatic.  Cardiovascular:     Rate and Rhythm: Normal rate and regular rhythm.  Pulmonary:     Effort: Pulmonary effort is normal. No respiratory distress.     Breath sounds: Normal breath sounds. No wheezing.  Abdominal:     General: Bowel sounds are normal. There is no distension.     Tenderness: There is no abdominal tenderness. There is no guarding or rebound.     Comments:    Musculoskeletal:        General: No swelling or tenderness.  Neurological:     Mental Status: She is alert. Mental status is at baseline.     Sensory: No sensory deficit.     Motor: No weakness.     Comments: Gait normal No nystagmus EOMI Strength and sensations intact Finger nose neg Heel to shin neg     Labs reviewed: Basic Metabolic Panel: No results for input(s): NA, K, CL, CO2, GLUCOSE, BUN, CREATININE, CALCIUM, MG, PHOS, TSH in the last 8760 hours. Liver Function Tests: No results for input(s): AST, ALT, ALKPHOS, BILITOT, PROT, ALBUMIN in the last 8760 hours. No results for input(s): LIPASE, AMYLASE in the last 8760 hours. No results for input(s): AMMONIA in the last 8760 hours. CBC: No results for input(s): WBC, NEUTROABS, HGB, HCT, MCV, PLT in the last 8760 hours. Lipid Panel: No results for input(s): CHOL, HDL, LDLCALC, TRIG, CHOLHDL, LDLDIRECT in the last 8760 hours. TSH: No results for input(s): TSH in the last 8760 hours. A1C: Lab Results  Component Value Date   HGBA1C 6.0 (H) 07/11/2018    Assessment &  Plan Primary hypertension Bp high  Not on medications prior Will start losartan  Follow up in 2 weeks Monitor bp daily and keep a log Avoid salty foods Exercise regularly  Orders:   CBC with Differential/Platelet   COMPLETE METABOLIC PANEL WITHOUT GFR   Urinalysis   losartan  (COZAAR ) 50 MG tablet; Take 1 tablet (50 mg total) by mouth daily.  Chronic tension-type headache, not intractable Neuro exam unremarkable Pt reports worsening headaches since 2 months Will get MRI brain Orders:   MR Brain Wo Contrast; Future  Screening for diabetes mellitus  Orders:   Hemoglobin A1c  Screening for lipid disorders  Orders:   Lipid panel  Screening for thyroid  disorder  Orders:   TSH  Immunization due Flu shot today  Breast cancer screening by mammogram H/o left breast cancer Will get mammogram Orders:   MM 3D SCREENING MAMMOGRAM BILATERAL BREAST; Future  Screening for STD (sexually transmitted disease)  Orders:   Hepatitis C Antibody   HIV antibody (with reflex)  Postmenopausal estrogen deficiency  Orders:   DG Bone Density; Future   Return in about 2 weeks (around 01/28/2024).:   Henli Hey    [1] No Known Allergies [2]  Social History Tobacco Use   Smoking status: Never    Passive exposure: Never   Smokeless tobacco: Never  Vaping Use   Vaping status: Never Used  Substance Use Topics   Alcohol use: No   Drug use: No  [3] No Known Allergies  "

## 2024-01-15 ENCOUNTER — Ambulatory Visit: Payer: Self-pay | Admitting: Sports Medicine

## 2024-01-15 DIAGNOSIS — R319 Hematuria, unspecified: Secondary | ICD-10-CM

## 2024-01-15 LAB — COMPLETE METABOLIC PANEL WITHOUT GFR
AG Ratio: 1.8 (calc) (ref 1.0–2.5)
ALT: 10 U/L (ref 6–29)
AST: 17 U/L (ref 10–35)
Albumin: 5 g/dL (ref 3.6–5.1)
Alkaline phosphatase (APISO): 65 U/L (ref 37–153)
BUN: 17 mg/dL (ref 7–25)
CO2: 31 mmol/L (ref 20–32)
Calcium: 9.7 mg/dL (ref 8.6–10.4)
Chloride: 103 mmol/L (ref 98–110)
Creat: 0.67 mg/dL (ref 0.50–1.05)
Globulin: 2.8 g/dL (ref 1.9–3.7)
Glucose, Bld: 102 mg/dL — ABNORMAL HIGH (ref 65–99)
Potassium: 4.2 mmol/L (ref 3.5–5.3)
Sodium: 140 mmol/L (ref 135–146)
Total Bilirubin: 0.7 mg/dL (ref 0.2–1.2)
Total Protein: 7.8 g/dL (ref 6.1–8.1)

## 2024-01-15 LAB — HIV ANTIBODY (ROUTINE TESTING W REFLEX)
HIV 1&2 Ab, 4th Generation: NONREACTIVE
HIV FINAL INTERPRETATION: NEGATIVE

## 2024-01-15 LAB — HEPATITIS C ANTIBODY: Hepatitis C Ab: NONREACTIVE

## 2024-01-28 ENCOUNTER — Ambulatory Visit: Admitting: Sports Medicine

## 2024-01-29 ENCOUNTER — Ambulatory Visit (HOSPITAL_BASED_OUTPATIENT_CLINIC_OR_DEPARTMENT_OTHER): Admission: RE | Admit: 2024-01-29 | Source: Ambulatory Visit

## 2024-02-04 ENCOUNTER — Ambulatory Visit: Admitting: Sports Medicine

## 2024-02-11 ENCOUNTER — Ambulatory Visit: Admitting: Sports Medicine

## 2024-02-18 ENCOUNTER — Other Ambulatory Visit (HOSPITAL_BASED_OUTPATIENT_CLINIC_OR_DEPARTMENT_OTHER)

## 2024-02-18 ENCOUNTER — Ambulatory Visit (HOSPITAL_BASED_OUTPATIENT_CLINIC_OR_DEPARTMENT_OTHER)

## 2024-06-26 ENCOUNTER — Ambulatory Visit: Payer: Self-pay | Admitting: Radiology
